# Patient Record
Sex: Female | Born: 1960 | Race: White | Hispanic: No | Marital: Married | Smoking: Never smoker
Health system: Southern US, Community
[De-identification: ages and names within clinical notes are randomized; demographics above are authoritative.]

## PROBLEM LIST (undated history)

## (undated) DIAGNOSIS — E785 Hyperlipidemia, unspecified: Secondary | ICD-10-CM

## (undated) DIAGNOSIS — T7840XA Allergy, unspecified, initial encounter: Secondary | ICD-10-CM

## (undated) DIAGNOSIS — R002 Palpitations: Secondary | ICD-10-CM

## (undated) DIAGNOSIS — I10 Essential (primary) hypertension: Secondary | ICD-10-CM

## (undated) DIAGNOSIS — K219 Gastro-esophageal reflux disease without esophagitis: Secondary | ICD-10-CM

## (undated) DIAGNOSIS — E559 Vitamin D deficiency, unspecified: Secondary | ICD-10-CM

## (undated) DIAGNOSIS — M722 Plantar fascial fibromatosis: Secondary | ICD-10-CM

## (undated) DIAGNOSIS — G5600 Carpal tunnel syndrome, unspecified upper limb: Secondary | ICD-10-CM

## (undated) DIAGNOSIS — R739 Hyperglycemia, unspecified: Secondary | ICD-10-CM

## (undated) HISTORY — DX: Carpal tunnel syndrome, unspecified upper limb: G56.00

## (undated) HISTORY — PX: TONSILLECTOMY: SUR1361

## (undated) HISTORY — PX: MENISCUS REPAIR: SHX5179

## (undated) HISTORY — DX: Hyperglycemia, unspecified: R73.9

## (undated) HISTORY — DX: Essential (primary) hypertension: I10

## (undated) HISTORY — PX: FEMUR FRACTURE SURGERY: SHX633

## (undated) HISTORY — DX: Hyperlipidemia, unspecified: E78.5

## (undated) HISTORY — DX: Plantar fascial fibromatosis: M72.2

## (undated) HISTORY — DX: Allergy, unspecified, initial encounter: T78.40XA

## (undated) HISTORY — DX: Vitamin D deficiency, unspecified: E55.9

## (undated) HISTORY — DX: Gastro-esophageal reflux disease without esophagitis: K21.9

## (undated) HISTORY — PX: CYST REMOVAL HAND: SHX6279

## (undated) HISTORY — DX: Palpitations: R00.2

---

## 2008-01-27 ENCOUNTER — Ambulatory Visit: Payer: Self-pay | Admitting: Family Medicine

## 2008-02-26 ENCOUNTER — Ambulatory Visit: Payer: Self-pay | Admitting: Orthopedic Surgery

## 2009-02-17 ENCOUNTER — Emergency Department: Payer: Self-pay | Admitting: Emergency Medicine

## 2009-02-21 ENCOUNTER — Ambulatory Visit: Payer: Self-pay | Admitting: Family Medicine

## 2009-03-27 HISTORY — PX: MENISCUS REPAIR: SHX5179

## 2009-11-28 IMAGING — US THYROID ULTRASOUND
1 series · 10 of 10 positions shown · non-contrast
Comparison: none

REASON FOR EXAM: lymphadenopathy  patient can show area of LT side of neck
COMMENTS:

PROCEDURE:     US  - US SOFT TISSUE HEAD/NECK  - February 21, 2009 [DATE]
RESULT:     Images obtained through the left side of the neck demonstrate no
definite solid or cystic abnormality. CT would be preferable for evaluation
of possible neck mass.

[Series 1: thyroid ultrasound · 10 of 10 slices shown]
[im 1/10]
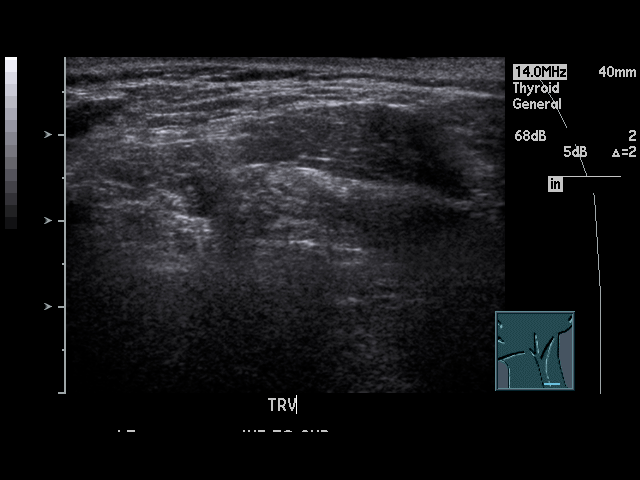
[im 2/10]
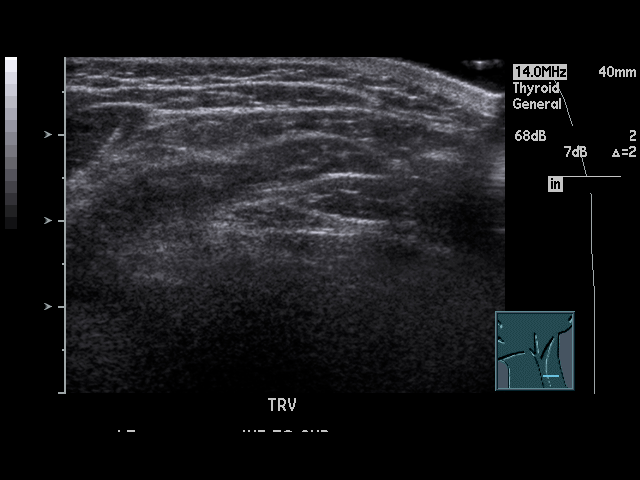
[im 3/10]
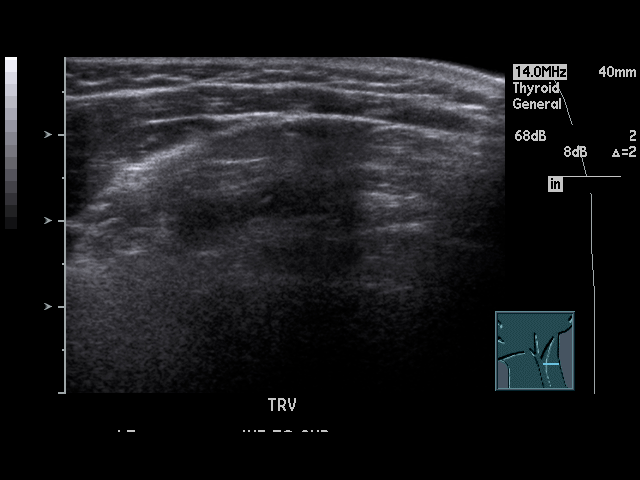
[im 4/10]
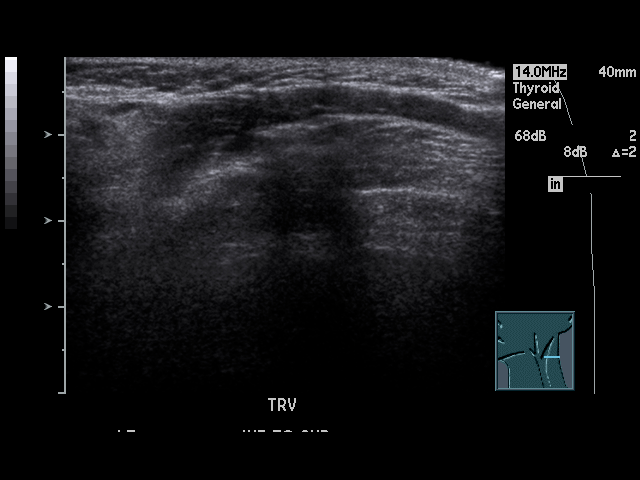
[im 5/10]
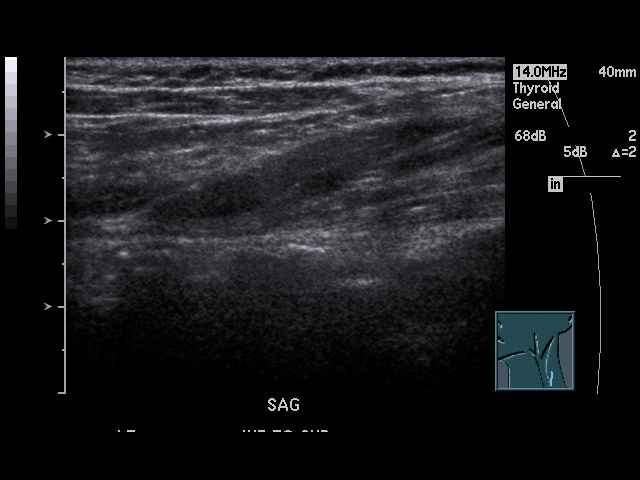
[im 6/10]
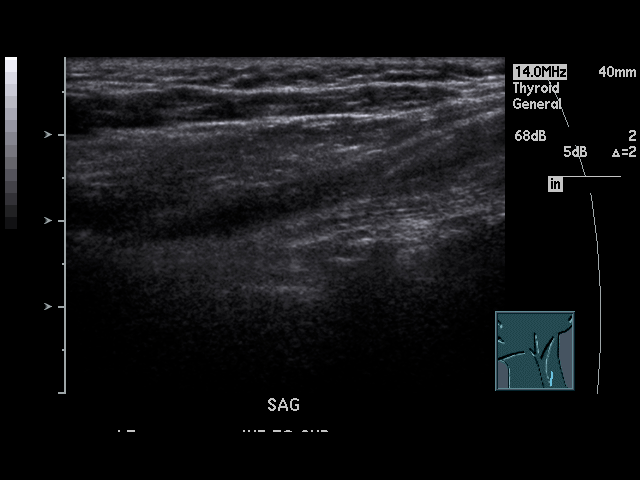
[im 7/10]
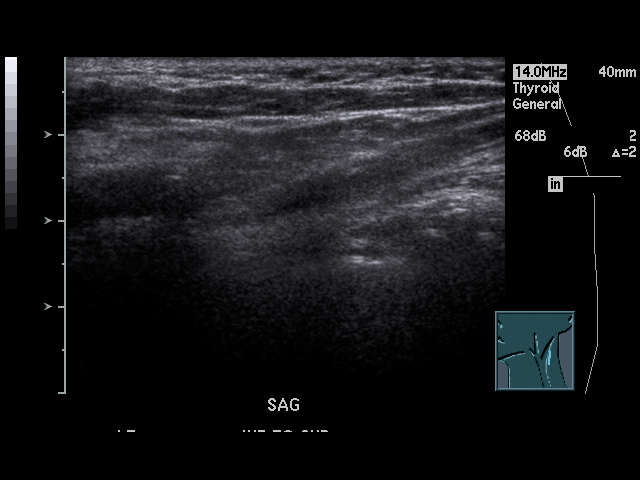
[im 8/10]
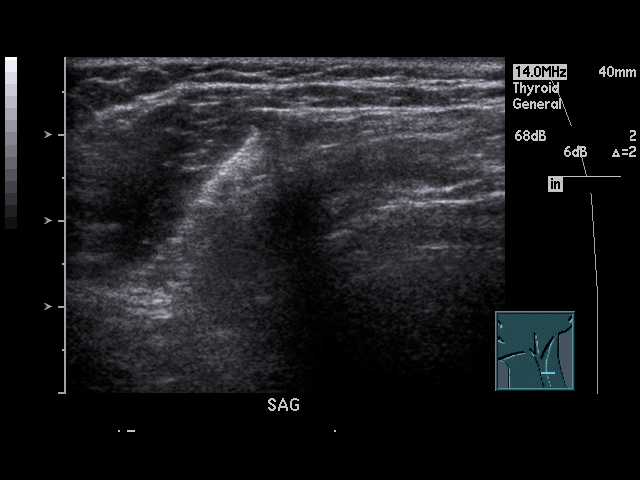
[im 9/10]
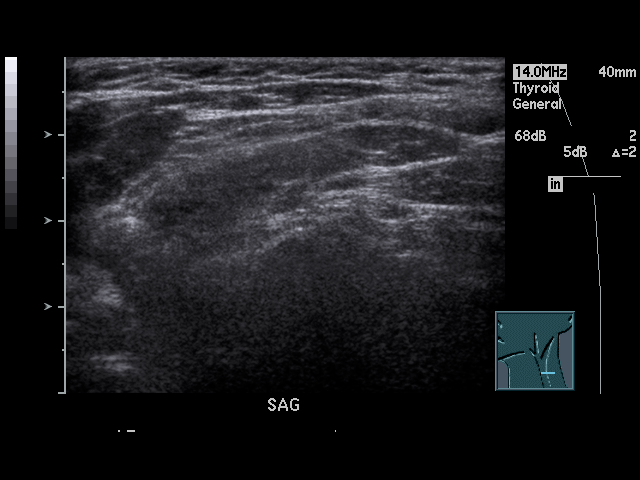
[im 10/10]
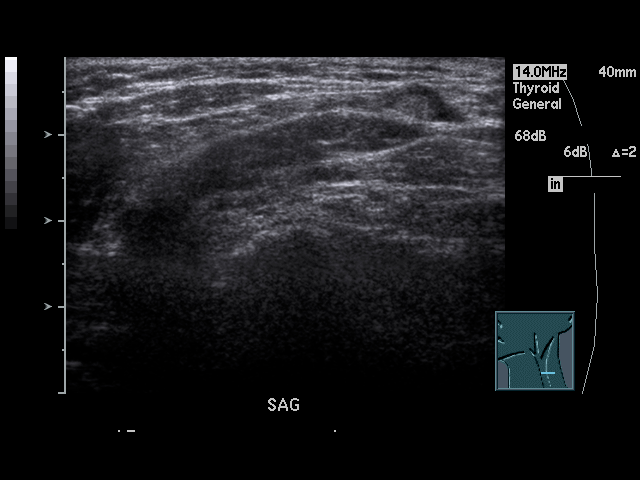

[10 of 10 positions shown; findings below may reference images not displayed]

IMPRESSION: Negative targeted soft tissue ultrasound.

## 2010-06-28 ENCOUNTER — Ambulatory Visit: Payer: Self-pay | Admitting: Family Medicine

## 2010-06-28 LAB — HM MAMMOGRAPHY: HM Mammogram: NORMAL

## 2010-09-12 ENCOUNTER — Ambulatory Visit: Payer: Self-pay | Admitting: Unknown Physician Specialty

## 2010-09-14 ENCOUNTER — Ambulatory Visit: Payer: Self-pay | Admitting: Unknown Physician Specialty

## 2010-09-17 LAB — PATHOLOGY REPORT

## 2013-07-15 ENCOUNTER — Encounter: Payer: Self-pay | Admitting: Podiatrist

## 2013-07-15 ENCOUNTER — Ambulatory Visit (INDEPENDENT_AMBULATORY_CARE_PROVIDER_SITE_OTHER): Payer: BC Managed Care – PPO | Admitting: Podiatrist

## 2013-07-15 ENCOUNTER — Ambulatory Visit (INDEPENDENT_AMBULATORY_CARE_PROVIDER_SITE_OTHER): Payer: BC Managed Care – PPO

## 2013-07-15 VITALS — BP 126/66 | HR 69 | Resp 18 | Ht 64.5 in | Wt 167.8 lb

## 2013-07-15 DIAGNOSIS — M2040 Other hammer toe(s) (acquired), unspecified foot: Secondary | ICD-10-CM

## 2013-07-15 DIAGNOSIS — Q828 Other specified congenital malformations of skin: Secondary | ICD-10-CM

## 2013-07-15 NOTE — Progress Notes (Signed)
   Subjective:    Patient ID: Cindy Larsen, female    DOB: September 20, 1960, 53 y.o.   MRN: [id number]  HPI Comments: There is a spot on the bottom of one of my feet and a curled up toe on the other  Left foot porokeratosis mid foot down 4th met . Sore , tried to pick at it.  Right #2 toe sometimes hurts it depends on the day      Review of Systems  All other systems reviewed and are negative.       Objective:   Physical Exam GENERAL APPEARANCE: Alert, conversant. Appropriately groomed. No acute distress.  VASCULAR: Pedal pulses palpable at 2/4 DP and PT bilateral.  Capillary refill time is immediate to all digits,  Proximal to distal cooling it warm to warm.  Digital hair growth is present bilateral  NEUROLOGIC: sensation is intact epicritically and protectively to 5.07 monofilament at 5/5 sites bilateral.  Light touch is intact bilateral, vibratory sensation intact bilateral, achilles tendon reflex is intact bilateral.  MUSCULOSKELETAL: acceptable muscle strength, tone and stability bilateral.  Hammertoe deformity right 2nd toe.  Mild flexion contracture digits 3,4 right foot as well.  otherwise Rectus appearance of foot and digits noted bilateral.   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  Ground glass appearance noted.     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    Plan: Debridement of the porokeratotic lesion was carried out today without complication. Discussed hammertoe contracture right second toe and recommended bracing, shoe gear changes and lastly surgical correction. At this time the hammertoe is not bothersome and she will try conservative treatments.

## 2013-07-15 NOTE — Patient Instructions (Signed)
amlactin or LacHydrin are good moisturizers for dry feet-- in the foot care isle at the pharmacy  Wart remover acid will help the little plugged sweat duct on your left foot.  Apply daily for 5 days and then pop out the little center piece

## 2014-06-27 HISTORY — PX: TRIGGER FINGER RELEASE: SHX641

## 2014-06-27 HISTORY — PX: CARPAL TUNNEL RELEASE: SHX101

## 2014-10-13 ENCOUNTER — Other Ambulatory Visit: Payer: Self-pay | Admitting: Family Medicine

## 2014-10-13 DIAGNOSIS — Z1239 Encounter for other screening for malignant neoplasm of breast: Secondary | ICD-10-CM

## 2014-10-13 LAB — LIPID PANEL
Cholesterol: 183 mg/dL (ref 0–200)
HDL: 58 mg/dL (ref 35–70)
LDL CALC: 96 mg/dL
Triglycerides: 143 mg/dL (ref 40–160)

## 2014-10-13 LAB — HM PAP SMEAR: HM Pap smear: NORMAL

## 2014-10-13 LAB — HEMOGLOBIN A1C: Hgb A1c MFr Bld: 6.1 % — AB (ref 4.0–6.0)

## 2015-04-25 ENCOUNTER — Other Ambulatory Visit: Payer: Self-pay

## 2015-04-27 ENCOUNTER — Other Ambulatory Visit: Payer: Self-pay

## 2015-04-27 MED ORDER — AMLODIPINE BESY-BENAZEPRIL HCL 5-20 MG PO CAPS: 1.0000 | ORAL_CAPSULE | Freq: Every day | ORAL | Status: DC

## 2015-04-27 MED ORDER — HYDROCHLOROTHIAZIDE 12.5 MG PO CAPS: 12.5000 mg | ORAL_CAPSULE | Freq: Every day | ORAL | Status: DC

## 2015-04-27 MED ORDER — LEVOCETIRIZINE DIHYDROCHLORIDE 5 MG PO TABS: 5.0000 mg | ORAL_TABLET | Freq: Every evening | ORAL | Status: DC

## 2015-04-27 NOTE — Telephone Encounter (Signed)
Left voice message for patient to return call to schedule appointment. °

## 2015-05-01 ENCOUNTER — Encounter: Payer: Self-pay | Admitting: Family Medicine

## 2015-05-01 ENCOUNTER — Ambulatory Visit (INDEPENDENT_AMBULATORY_CARE_PROVIDER_SITE_OTHER): Payer: BLUE CROSS/BLUE SHIELD | Admitting: Family Medicine

## 2015-05-01 VITALS — BP 134/86 | HR 98 | Temp 97.8°F | Resp 18 | Ht 63.0 in | Wt 175.2 lb

## 2015-05-01 DIAGNOSIS — K219 Gastro-esophageal reflux disease without esophagitis: Secondary | ICD-10-CM

## 2015-05-01 DIAGNOSIS — J309 Allergic rhinitis, unspecified: Secondary | ICD-10-CM | POA: Diagnosis not present

## 2015-05-01 DIAGNOSIS — E669 Obesity, unspecified: Secondary | ICD-10-CM

## 2015-05-01 DIAGNOSIS — E8881 Metabolic syndrome: Secondary | ICD-10-CM | POA: Diagnosis not present

## 2015-05-01 DIAGNOSIS — Z1211 Encounter for screening for malignant neoplasm of colon: Secondary | ICD-10-CM

## 2015-05-01 DIAGNOSIS — Z23 Encounter for immunization: Secondary | ICD-10-CM | POA: Diagnosis not present

## 2015-05-01 DIAGNOSIS — E663 Overweight: Secondary | ICD-10-CM | POA: Insufficient documentation

## 2015-05-01 DIAGNOSIS — Z1239 Encounter for other screening for malignant neoplasm of breast: Secondary | ICD-10-CM | POA: Diagnosis not present

## 2015-05-01 DIAGNOSIS — I10 Essential (primary) hypertension: Secondary | ICD-10-CM | POA: Diagnosis not present

## 2015-05-01 DIAGNOSIS — E785 Hyperlipidemia, unspecified: Secondary | ICD-10-CM | POA: Insufficient documentation

## 2015-05-01 DIAGNOSIS — J3089 Other allergic rhinitis: Secondary | ICD-10-CM

## 2015-05-01 DIAGNOSIS — E559 Vitamin D deficiency, unspecified: Secondary | ICD-10-CM | POA: Diagnosis not present

## 2015-05-01 MED ORDER — RANITIDINE HCL 300 MG PO CAPS: 300.0000 mg | ORAL_CAPSULE | Freq: Every evening | ORAL | Status: DC

## 2015-05-01 MED ORDER — LEVOCETIRIZINE DIHYDROCHLORIDE 5 MG PO TABS: 5.0000 mg | ORAL_TABLET | Freq: Every evening | ORAL | Status: DC

## 2015-05-01 MED ORDER — AMLODIPINE BESY-BENAZEPRIL HCL 5-20 MG PO CAPS
1.0000 | ORAL_CAPSULE | Freq: Every day | ORAL | Status: DC
Start: 2015-05-01 — End: 2015-10-30

## 2015-05-01 MED ORDER — HYDROCHLOROTHIAZIDE 12.5 MG PO CAPS: 12.5000 mg | ORAL_CAPSULE | Freq: Every day | ORAL | Status: DC

## 2015-05-01 NOTE — Progress Notes (Signed)
Name: Cindy Larsen   MRN: [id number]    DOB: 04/12/1961   Date:05/01/2015       Progress Note  Subjective  Chief Complaint  Chief Complaint  Patient presents with  . Medication Refill    6 month F/U  . Hypertension  . Allergic Rhinitis     Well controlled with medications.  . Gastroesophageal Reflux    Well controlled only takes medication prn    HPI  HTN: she has been taking Lotrel and HCTZ. No side effects of medication, occasionally a mild ankle swelling. No chest pain or palpitation ( resolved when she stopped drinking caffeinated beverage).  AR: symptoms are year round. She usually has nasal congestion, sneezing and itchy watery eyes. Controlled at this time with medication. Symptoms are worse during the spring.   GERD: doing very well lately. Taking Omeprazole prn only, at most twice monthly.  When symptoms are present feels a heartburn sensation, no regurgitation. Discussed stopping PPI and only taking Ranitidine prn .   Dyslipidemia: had labs done in May and is on diet only  Vitamin D : taking supplements.   Metabolic Syndrome: denies polyphagia, polydipsia or polyuria. She is trying to cut down on sweets and is also only drinking diet drinks.   Obesity: discussed her BMI, explained that losing about 10 lbs will be very beneficial to her.   Patient Active Problem List   Diagnosis Date Noted  . Benign essential HTN 05/01/2015  . Dyslipidemia 05/01/2015  . Gastro-esophageal reflux disease without esophagitis 05/01/2015  . Metabolic syndrome 05/01/2015  . Obesity 05/01/2015  . Perennial allergic rhinitis 05/01/2015  . Vitamin D deficiency 08/02/2009    Past Surgical History  Procedure Laterality Date  . Cyst removal hand    . Meniscus repair    . Tonsillectomy    . Femur fracture surgery Left   . Meniscus repair Left 03/27/2009  . Carpal tunnel release Right 06/2014  . Trigger finger release Right 06/2014    Family History  Problem Relation Age of Onset   . Diabetes Mother     Social History   Social History  . Marital Status: Married    Spouse Name: N/A  . Number of Children: N/A  . Years of Education: N/A   Occupational History  . Not on file.   Social History Main Topics  . Smoking status: Never Smoker   . Smokeless tobacco: Never Used  . Alcohol Use: No  . Drug Use: No  . Sexual Activity:    Partners: Male   Other Topics Concern  . Not on file   Social History Narrative     Current outpatient prescriptions:  .  amLODipine-benazepril (LOTREL) 5-20 MG capsule, Take 1 capsule by mouth daily., Disp: 90 capsule, Rfl: 1 .  Cholecalciferol (VITAMIN D) 2000 UNITS tablet, Take by mouth., Disp: , Rfl:  .  hydrochlorothiazide (MICROZIDE) 12.5 MG capsule, Take 1 capsule (12.5 mg total) by mouth daily., Disp: 90 capsule, Rfl: 1 .  levocetirizine (XYZAL) 5 MG tablet, Take 1 tablet (5 mg total) by mouth every evening., Disp: 90 tablet, Rfl: 1  No Known Allergies   ROS  Constitutional: Negative for fever or weight change.  Respiratory: Negative for cough and shortness of breath.   Cardiovascular: Negative for chest pain or palpitations.  Gastrointestinal: Negative for abdominal pain, no bowel changes.  Musculoskeletal: Negative for gait problem or joint swelling.  Skin: Negative for rash.  Neurological: Negative for dizziness or headache.  No other  specific complaints in a complete review of systems (except as listed in HPI above).   Objective  Filed Vitals:   05/01/15 1528  BP: 134/86  Pulse: 98  Temp: 97.8 F (36.6 C)  TempSrc: Oral  Resp: 18  Height: 5\' 3"  (1.6 m)  Weight: 175 lb 3.2 oz (79.47 kg)  SpO2: 95%    Body mass index is 31.04 kg/(m^2).  Physical Exam  Constitutional: Patient appears well-developed and well-nourished. Obese No distress.  HEENT: head atraumatic, normocephalic, pupils equal and reactive to light,neck supple, throat within normal limits Cardiovascular: Normal rate, regular rhythm  and normal heart sounds.  No murmur heard. No BLE edema. Pulmonary/Chest: Effort normal and breath sounds normal. No respiratory distress. Abdominal: Soft.  There is no tenderness. Psychiatric: Patient has a normal mood and affect. behavior is normal. Judgment and thought content normal.  PHQ2/9: Depression screen PHQ 2/9 05/01/2015  Decreased Interest 0  Down, Depressed, Hopeless 0  PHQ - 2 Score 0    Fall Risk: Fall Risk  05/01/2015  Falls in the past year? No     Functional Status Survey: Is the patient deaf or have difficulty hearing?: No Does the patient have difficulty seeing, even when wearing glasses/contacts?: Yes (glasses) Does the patient have difficulty concentrating, remembering, or making decisions?: No Does the patient have difficulty walking or climbing stairs?: No Does the patient have difficulty dressing or bathing?: No Does the patient have difficulty doing errands alone such as visiting a doctor's office or shopping?: No    Assessment & Plan  1. Benign essential HTN  - hydrochlorothiazide (MICROZIDE) 12.5 MG capsule; Take 1 capsule (12.5 mg total) by mouth daily.  Dispense: 90 capsule; Refill: 1 - amLODipine-benazepril (LOTREL) 5-20 MG capsule; Take 1 capsule by mouth daily.  Dispense: 90 capsule; Refill: 1  2. Needs flu shot  - Flu Vaccine QUAD 36+ mos PF IM (Fluarix & Fluzone Quad PF) -refused  3. Perennial allergic rhinitis  - levocetirizine (XYZAL) 5 MG tablet; Take 1 tablet (5 mg total) by mouth every evening.  Dispense: 90 tablet; Refill: 1  4. Metabolic syndrome  Discussed life style changes.   5. Dyslipidemia  Continue dietary modification   6. Gastro-esophageal reflux disease without esophagitis  - ranitidine (ZANTAC) 300 MG capsule; Take 1 capsule (300 mg total) by mouth every evening.  Dispense: 90 capsule; Refill: 0  7. Vitamin D deficiency  Continue supplements  8. Obesity  Discussed with the patient the risk posed by an  increased BMI. Discussed importance of portion control, calorie counting and at least 150 minutes of physical activity weekly. Avoid sweet beverages and drink more water. Eat at least 6 servings of fruit and vegetables daily   9. Breast cancer screening  - MM Digital Screening; Future  10. Colon cancer screening  She does not want to have a colonoscopy but is willing to call insurance and find out about cologuard coverage - Cologuard

## 2015-05-04 ENCOUNTER — Telehealth: Payer: Self-pay | Admitting: Family Medicine

## 2015-05-04 MED ORDER — AZITHROMYCIN 250 MG PO TABS: ORAL_TABLET | ORAL | Status: DC

## 2015-05-04 NOTE — Telephone Encounter (Signed)
Patient informed. 

## 2015-05-04 NOTE — Telephone Encounter (Signed)
She was seen on Monday and since then she became congested, ears fill full with slight cough. It began Tuesday. Patient is taking Mucinex and it does not seem to be working. [facility] send antibiotic [facility].

## 2015-05-04 NOTE — Telephone Encounter (Signed)
done

## 2015-06-29 ENCOUNTER — Ambulatory Visit (INDEPENDENT_AMBULATORY_CARE_PROVIDER_SITE_OTHER): Payer: BLUE CROSS/BLUE SHIELD | Admitting: Family Medicine

## 2015-06-29 ENCOUNTER — Encounter: Payer: Self-pay | Admitting: Family Medicine

## 2015-06-29 VITALS — BP 120/78 | HR 116 | Temp 98.0°F | Resp 16 | Ht 63.0 in | Wt 176.1 lb

## 2015-06-29 DIAGNOSIS — J069 Acute upper respiratory infection, unspecified: Secondary | ICD-10-CM | POA: Diagnosis not present

## 2015-06-29 NOTE — Progress Notes (Signed)
Name: Cindy Larsen   MRN: [id number]    DOB: 13-Mar-1961   Date:06/29/2015       Progress Note  Subjective  Chief Complaint  Chief Complaint  Patient presents with  . URI    Onset 1 day, syptoms include: sore throat, congestion, bilateral ear pain    URI  This is a new problem. The current episode started yesterday. There has been no fever. Associated symptoms include congestion, ear pain, a plugged ear sensation and a sore throat. Pertinent negatives include no coughing or sinus pain. She has tried nothing for the symptoms.    Past Medical History  Diagnosis Date  . Hypertension   . Allergy   . Vitamin D deficiency   . Palpitations   . Carpal tunnel syndrome   . GERD (gastroesophageal reflux disease)   . Dyslipidemia   . Plantar fasciitis, right   . Hyperglycemia     Past Surgical History  Procedure Laterality Date  . Cyst removal hand    . Meniscus repair    . Tonsillectomy    . Femur fracture surgery Left   . Meniscus repair Left 03/27/2009  . Carpal tunnel release Right 06/2014  . Trigger finger release Right 06/2014    Family History  Problem Relation Age of Onset  . Diabetes Mother     Social History   Social History  . Marital Status: Married    Spouse Name: N/A  . Number of Children: N/A  . Years of Education: N/A   Occupational History  . Not on file.   Social History Main Topics  . Smoking status: Never Smoker   . Smokeless tobacco: Never Used  . Alcohol Use: No  . Drug Use: No  . Sexual Activity:    Partners: Male   Other Topics Concern  . Not on file   Social History Narrative     Current outpatient prescriptions:  .  amLODipine-benazepril (LOTREL) 5-20 MG capsule, Take 1 capsule by mouth daily., Disp: 90 capsule, Rfl: 1 .  Cholecalciferol (VITAMIN D) 2000 UNITS tablet, Take by mouth., Disp: , Rfl:  .  hydrochlorothiazide (MICROZIDE) 12.5 MG capsule, Take 1 capsule (12.5 mg total) by mouth daily., Disp: 90 capsule, Rfl: 1 .   levocetirizine (XYZAL) 5 MG tablet, Take 1 tablet (5 mg total) by mouth every evening., Disp: 90 tablet, Rfl: 1 .  ranitidine (ZANTAC) 300 MG capsule, Take 1 capsule (300 mg total) by mouth every evening., Disp: 90 capsule, Rfl: 0  No Known Allergies   Review of Systems  Constitutional: Negative for fever and chills.  HENT: Positive for congestion, ear pain and sore throat.   Respiratory: Negative for cough.      Objective  Filed Vitals:   06/29/15 1359  BP: 120/78  Pulse: 116  Temp: 98 F (36.7 C)  TempSrc: Oral  Resp: 16  Height:  (1.6 m)  Weight: 176 lb 1.6 oz (79.878 kg)  SpO2: 96%    Physical Exam  Constitutional: She is oriented to person, place, and time and well-developed, well-nourished, and in no distress.  HENT:  Head: Normocephalic and atraumatic.  Right Ear: Tympanic membrane and ear canal normal. No drainage. No middle ear effusion.  Left Ear: Tympanic membrane and ear canal normal. No drainage.  No middle ear effusion.  Nose: Right sinus exhibits no maxillary sinus tenderness and no frontal sinus tenderness. Left sinus exhibits no maxillary sinus tenderness and no frontal sinus tenderness.  Mouth/Throat: Posterior oropharyngeal erythema present.  No oropharyngeal exudate or posterior oropharyngeal edema.  Nasal turibinate hypertrophied, nasal mucosa inflamed Inflamed oropharynx w/o injection.  Cardiovascular: Normal rate, regular rhythm and normal heart sounds.   Pulmonary/Chest: Effort normal and breath sounds normal.  Neurological: She is alert and oriented to person, place, and time.  Nursing note and vitals reviewed.    Assessment & Plan  1. Viral URI Recommended conservative measures including increased hydration. May take a decongestant for relief. If symptoms persistent beyond 5 days, will call for ABX therapy.   Hyun Marsalis Asad [facility] 06/29/2015 2:13 PM

## 2015-10-30 ENCOUNTER — Encounter: Payer: Self-pay | Admitting: Family Medicine

## 2015-10-30 ENCOUNTER — Ambulatory Visit (INDEPENDENT_AMBULATORY_CARE_PROVIDER_SITE_OTHER): Payer: BLUE CROSS/BLUE SHIELD | Admitting: Family Medicine

## 2015-10-30 VITALS — BP 116/74 | HR 75 | Temp 97.9°F | Resp 16 | Ht 63.0 in | Wt 179.1 lb

## 2015-10-30 DIAGNOSIS — E559 Vitamin D deficiency, unspecified: Secondary | ICD-10-CM | POA: Diagnosis not present

## 2015-10-30 DIAGNOSIS — E8881 Metabolic syndrome: Secondary | ICD-10-CM

## 2015-10-30 DIAGNOSIS — J3089 Other allergic rhinitis: Secondary | ICD-10-CM

## 2015-10-30 DIAGNOSIS — K219 Gastro-esophageal reflux disease without esophagitis: Secondary | ICD-10-CM

## 2015-10-30 DIAGNOSIS — E785 Hyperlipidemia, unspecified: Secondary | ICD-10-CM

## 2015-10-30 DIAGNOSIS — R252 Cramp and spasm: Secondary | ICD-10-CM | POA: Diagnosis not present

## 2015-10-30 DIAGNOSIS — Z1239 Encounter for other screening for malignant neoplasm of breast: Secondary | ICD-10-CM

## 2015-10-30 DIAGNOSIS — I10 Essential (primary) hypertension: Secondary | ICD-10-CM

## 2015-10-30 DIAGNOSIS — J309 Allergic rhinitis, unspecified: Secondary | ICD-10-CM | POA: Diagnosis not present

## 2015-10-30 MED ORDER — HYDROCHLOROTHIAZIDE 12.5 MG PO CAPS: 12.5000 mg | ORAL_CAPSULE | Freq: Every day | ORAL | Status: DC

## 2015-10-30 MED ORDER — AMLODIPINE BESY-BENAZEPRIL HCL 5-20 MG PO CAPS: 1.0000 | ORAL_CAPSULE | Freq: Every day | ORAL | Status: DC

## 2015-10-30 MED ORDER — LEVOCETIRIZINE DIHYDROCHLORIDE 5 MG PO TABS: 5.0000 mg | ORAL_TABLET | Freq: Every evening | ORAL | Status: DC

## 2015-10-30 NOTE — Patient Instructions (Signed)
Restless Legs Syndrome Restless legs syndrome is a condition that causes uncomfortable feelings or sensations in the legs, especially while sitting or lying down. The sensations usually cause an overwhelming urge to move the legs. The arms can also sometimes be affected. The condition can range from mild to severe. The symptoms often interfere with a person's ability to sleep. CAUSES The cause of this condition is not known. RISK FACTORS This condition is more likely to develop in:  People who are older than age 50.  Pregnant women. In general, restless legs syndrome is more common in women than in men.  People who have a family history of the condition.  People who have certain medical conditions, such as iron deficiency, kidney disease, Parkinson disease, or nerve damage.  People who take certain medicines, such as medicines for high blood pressure, nausea, colds, allergies, depression, and some heart conditions. SYMPTOMS The main symptom of this condition is uncomfortable sensations in the legs. These sensations may be:  Described as pulling, tingling, prickling, throbbing, crawling, or burning.  Worse while you are sitting or lying down.  Worse during periods of rest or inactivity.  Worse at night, often interfering with your sleep.  Accompanied by a very strong urge to move your legs.  Temporarily relieved by movement of your legs. The sensations usually affect both sides of the body. The arms can also be affected, but this is rare. People who have this condition often have tiredness during the day because of their lack of sleep at night. DIAGNOSIS This condition may be diagnosed based on your description of the symptoms. You may also have tests, including blood tests, to check for other conditions that may lead to your symptoms. In some cases, you may be asked to spend some time in a sleep lab so your sleeping can be monitored. TREATMENT Treatment for this condition is  focused on managing the symptoms. Treatment may include:  Self-help and lifestyle changes.  Medicines. HOME CARE INSTRUCTIONS  Take medicines only as directed by your health care provider.  Try these methods to get temporary relief from the uncomfortable sensations:  Massage your legs.  Walk or stretch.  Take a cold or hot bath.  Practice good sleep habits. For example, go to bed and get up at the same time every day.  Exercise regularly.  Practice ways of relaxing, such as yoga or meditation.  Avoid caffeine and alcohol.  Do not use any tobacco products, including cigarettes, chewing tobacco, or electronic cigarettes. If you need help quitting, ask your health care provider.  Keep all follow-up visits as directed by your health care provider. This is important. SEEK MEDICAL CARE IF: Your symptoms do not improve with treatment, or they get worse.   This information is not intended to replace advice given to you by your health care provider. Make sure you discuss any questions you have with your health care provider.   Document Released: 05/03/2002 Document Revised: 09/27/2014 Document Reviewed: 05/09/2014 Elsevier Interactive Patient Education 2016 [employer]  

## 2015-10-30 NOTE — Progress Notes (Signed)
Name: Cindy Larsen   MRN: [id number]    DOB: 1960/07/10   Date:10/30/2015       Progress Note  Subjective  Chief Complaint  Chief Complaint  Patient presents with  . Medication Refill  . Hypertension  . Allergic Rhinitis     Well controlled with medication daily   . Gastroesophageal Reflux    Takes medication as needed    HPI   She was scheduled for a CPE but asked to change to medication refill visit, will return for CPE  HTN: she has been taking Lotrel and HCTZ. No side effects of medication, occasionally a mild ankle swelling. No chest pain or palpitation. BP is towards low end of normal, but denies orthostatic changes.  AR: symptoms are year round. She usually has nasal congestion, sneezing or  itchy watery eyes when not taking medication. Controlled at this time with medication.  GERD: doing very well lately. Off PPI, only taking Ranitidine prn .   Dyslipidemia: had labs done in May and is on diet only  Vitamin D : taking supplements, we will recheck labs before her CPE  Metabolic Syndrome: denies polyphagia, polydipsia or polyuria. She is still avoiding sweet beverages but has not been as compliant with food choices, eating more cookies and desserts.  Obesity: discussed her BMI, explained that losing about 10 lbs will be very beneficial to her. Discussed increasing physical activity and avoid eating "junk food"  Patient Active Problem List   Diagnosis Date Noted  . Benign essential HTN 05/01/2015  . Dyslipidemia 05/01/2015  . Gastro-esophageal reflux disease without esophagitis 05/01/2015  . Metabolic syndrome 05/01/2015  . Obesity 05/01/2015  . Perennial allergic rhinitis 05/01/2015  . Vitamin D deficiency 08/02/2009    Past Surgical History  Procedure Laterality Date  . Cyst removal hand    . Meniscus repair    . Tonsillectomy    . Femur fracture surgery Left   . Meniscus repair Left 03/27/2009  . Carpal tunnel release Right 06/2014  . Trigger finger  release Right 06/2014    Family History  Problem Relation Age of Onset  . Diabetes Mother     Social History   Social History  . Marital Status: Married    Spouse Name: N/A  . Number of Children: N/A  . Years of Education: N/A   Occupational History  . Not on file.   Social History Main Topics  . Smoking status: Never Smoker   . Smokeless tobacco: Never Used  . Alcohol Use: No  . Drug Use: No  . Sexual Activity:    Partners: Male   Other Topics Concern  . Not on file   Social History Narrative     Current outpatient prescriptions:  .  amLODipine-benazepril (LOTREL) 5-20 MG capsule, Take 1 capsule by mouth daily., Disp: 90 capsule, Rfl: 1 .  Cholecalciferol (VITAMIN D) 2000 UNITS tablet, Take by mouth., Disp: , Rfl:  .  hydrochlorothiazide (MICROZIDE) 12.5 MG capsule, Take 1 capsule (12.5 mg total) by mouth daily., Disp: 90 capsule, Rfl: 1 .  levocetirizine (XYZAL) 5 MG tablet, Take 1 tablet (5 mg total) by mouth every evening., Disp: 90 tablet, Rfl: 1 .  ranitidine (ZANTAC) 300 MG capsule, Take 1 capsule (300 mg total) by mouth every evening., Disp: 90 capsule, Rfl: 0  No Known Allergies   ROS  Constitutional: Negative for fever or weight change.  Respiratory: Negative for cough and shortness of breath.   Cardiovascular: Negative for chest pain or  palpitations.  Gastrointestinal: Negative for abdominal pain, no bowel changes.  Musculoskeletal: Negative for gait problem or joint swelling.  Skin: Negative for rash.  Neurological: Negative for dizziness or headache.  No other specific complaints in a complete review of systems (except as listed in HPI above).  Objective  Filed Vitals:   10/30/15 0830  BP: 116/74  Pulse: 75  Temp: 97.9 F (36.6 C)  TempSrc: Oral  Resp: 16  Height:  (1.6 m)  Weight: 179 lb 1.6 oz (81.239 kg)  SpO2: 97%    Body mass index is 31.73 kg/(m^2).  Physical Exam  Constitutional: Patient appears well-developed and  well-nourished. Obese  No distress.  HEENT: head atraumatic, normocephalic, pupils equal and reactive to light, neck supple, throat within normal limits Cardiovascular: Normal rate, regular rhythm and normal heart sounds.  No murmur heard. No BLE edema. Pulmonary/Chest: Effort normal and breath sounds normal. No respiratory distress. Abdominal: Soft.  There is no tenderness. Psychiatric: Patient has a normal mood and affect. behavior is normal. Judgment and thought content normal.  PHQ2/9: Depression screen [facility] 2/9 10/30/2015 05/01/2015  Decreased Interest 0 0  Down, Depressed, Hopeless 0 0  PHQ - 2 Score 0 0    Fall Risk: Fall Risk  10/30/2015 05/01/2015  Falls in the past year? No No    Functional Status Survey: Is the patient deaf or have difficulty hearing?: No Does the patient have difficulty seeing, even when wearing glasses/contacts?: No Does the patient have difficulty concentrating, remembering, or making decisions?: No Does the patient have difficulty walking or climbing stairs?: No Does the patient have difficulty dressing or bathing?: No Does the patient have difficulty doing errands alone such as visiting a doctor's office or shopping?: No    Assessment & Plan  1. Perennial allergic rhinitis  - levocetirizine (XYZAL) 5 MG tablet; Take 1 tablet (5 mg total) by mouth every evening.  Dispense: 90 tablet; Refill: 1  2. Benign essential HTN  - hydrochlorothiazide (MICROZIDE) 12.5 MG capsule; Take 1 capsule (12.5 mg total) by mouth daily.  Dispense: 90 capsule; Refill: 1 - amLODipine-benazepril (LOTREL) 5-20 MG capsule; Take 1 capsule by mouth daily.  Dispense: 90 capsule; Refill: 1 - Comprehensive metabolic panel - CBC with Differential/Platelet  3. Dyslipidemia  - Lipid panel  4. Vitamin D deficiency  - VITAMIN D 25 Hydroxy (Vit-D Deficiency, Fractures)  5. Metabolic syndrome  - Hemoglobin A1c  6. Gastro-esophageal reflux disease without esophagitis  Doing  well with prn medication   7. Breast cancer screening  - MM Digital Screening; Future  8. Cramp of both lower extremities  - Magnesium

## 2015-11-18 ENCOUNTER — Encounter: Payer: Self-pay | Admitting: Family Medicine

## 2015-11-18 DIAGNOSIS — S83206A Unspecified tear of unspecified meniscus, current injury, right knee, initial encounter: Secondary | ICD-10-CM | POA: Insufficient documentation

## 2016-04-30 ENCOUNTER — Ambulatory Visit (INDEPENDENT_AMBULATORY_CARE_PROVIDER_SITE_OTHER): Payer: BLUE CROSS/BLUE SHIELD | Admitting: Family Medicine

## 2016-04-30 ENCOUNTER — Encounter: Payer: Self-pay | Admitting: Family Medicine

## 2016-04-30 VITALS — BP 118/70 | HR 88 | Temp 97.7°F | Resp 16 | Ht 64.25 in | Wt 177.2 lb

## 2016-04-30 DIAGNOSIS — E8881 Metabolic syndrome: Secondary | ICD-10-CM | POA: Diagnosis not present

## 2016-04-30 DIAGNOSIS — J3089 Other allergic rhinitis: Secondary | ICD-10-CM

## 2016-04-30 DIAGNOSIS — Z1211 Encounter for screening for malignant neoplasm of colon: Secondary | ICD-10-CM | POA: Diagnosis not present

## 2016-04-30 DIAGNOSIS — Z01419 Encounter for gynecological examination (general) (routine) without abnormal findings: Secondary | ICD-10-CM

## 2016-04-30 DIAGNOSIS — Z1231 Encounter for screening mammogram for malignant neoplasm of breast: Secondary | ICD-10-CM | POA: Diagnosis not present

## 2016-04-30 DIAGNOSIS — E785 Hyperlipidemia, unspecified: Secondary | ICD-10-CM

## 2016-04-30 DIAGNOSIS — I10 Essential (primary) hypertension: Secondary | ICD-10-CM

## 2016-04-30 DIAGNOSIS — Z0001 Encounter for general adult medical examination with abnormal findings: Secondary | ICD-10-CM | POA: Diagnosis not present

## 2016-04-30 DIAGNOSIS — E559 Vitamin D deficiency, unspecified: Secondary | ICD-10-CM

## 2016-04-30 DIAGNOSIS — R5383 Other fatigue: Secondary | ICD-10-CM

## 2016-04-30 DIAGNOSIS — Z1239 Encounter for other screening for malignant neoplasm of breast: Secondary | ICD-10-CM

## 2016-04-30 DIAGNOSIS — Z23 Encounter for immunization: Secondary | ICD-10-CM

## 2016-04-30 MED ORDER — HYDROCHLOROTHIAZIDE 12.5 MG PO CAPS: 12.5000 mg | ORAL_CAPSULE | Freq: Every day | ORAL | 1 refills | Status: DC

## 2016-04-30 MED ORDER — LEVOCETIRIZINE DIHYDROCHLORIDE 5 MG PO TABS: 5.0000 mg | ORAL_TABLET | Freq: Every evening | ORAL | 1 refills | Status: DC

## 2016-04-30 MED ORDER — AMLODIPINE BESY-BENAZEPRIL HCL 5-20 MG PO CAPS: 1.0000 | ORAL_CAPSULE | Freq: Every day | ORAL | 1 refills | Status: DC

## 2016-04-30 NOTE — Progress Notes (Signed)
Name: Cindy Larsen   MRN: [id number]    DOB: [birth year]   Date:04/30/2016       Progress Note  Subjective  Chief Complaint  Chief Complaint  Patient presents with  . Annual Exam  . Medication Refill    6 month F/U  . Hypertension    Denies any symptoms   . Allergic Rhinitis     Well controlled with medication  . Gastroesophageal Reflux    Takes prn as needed  . Dyslipidemia    HPI  Well Woman exam: she is up to date with pap smear, she is interested in Cologuard instead of colonoscopy, no breast lumps, she is due for a mammogram. She has nocturia, but she takes diuretic at night, no symptoms during the day.   HTN: she has been taking Lotrel and HCTZ. No side effects of medication, occasionally a mild ankle swelling. No chest pain or palpitation. BP is towards low end of normal, but denies orthostatic changes.   AR: symptoms are year round. She usually has nasal congestion, sneezing and itchy watery eyes when not taking medication. Controlled at this time with medication. She needs refills  GERD: doing very well lately. Off PPI, only taking Ranitidine prn . Very seldom, she states hot chocolate is the worse for her  Dyslipidemia: had labs done in May and is on diet only  Vitamin D : she has been off vitamin D supplementation   Metabolic Syndrome: denies polyphagia, polydipsia or polyuria. She is still avoiding sweet beverages but has not been as compliant with food choices  OA: she has OA bilaterally knee, seeing by [facility] , had steroid injections initially, and is now getting Synvisc to see if symptoms will improve.  She may need to have knee replacement surgery   Obesity: discussed her BMI, she still likes eating junk food, but lost 2 lbs since last visit. She has not been physically active   Patient Active Problem List   Diagnosis Date Noted  . Right knee meniscal tear 11/18/2015  . Cramp of both lower extremities 10/30/2015  . Benign essential HTN  05/01/2015  . Dyslipidemia 05/01/2015  . Gastro-esophageal reflux disease without esophagitis 05/01/2015  . Metabolic syndrome 05/01/2015  . Obesity 05/01/2015  . Perennial allergic rhinitis 05/01/2015  . Vitamin D deficiency 08/02/2009    Past Surgical History:  Procedure Laterality Date  . CARPAL TUNNEL RELEASE Right 06/2014  . CYST REMOVAL HAND    . FEMUR FRACTURE SURGERY Left   . MENISCUS REPAIR    . MENISCUS REPAIR Left 03/27/2009  . TONSILLECTOMY    . TRIGGER FINGER RELEASE Right 06/2014    Family History  Problem Relation Age of Onset  . Diabetes Mother     Social History   Social History  . Marital status: Married    Spouse name: N/A  . Number of children: N/A  . Years of education: N/A   Occupational History  . Not on file.   Social History Main Topics  . Smoking status: Never Smoker  . Smokeless tobacco: Never Used  . Alcohol use No  . Drug use: No  . Sexual activity: Yes    Partners: Male   Other Topics Concern  . Not on file   Social History Narrative  . No narrative on file     Current Outpatient Prescriptions:  .  amLODipine-benazepril (LOTREL) 5-20 MG capsule, Take 1 capsule by mouth daily., Disp: 90 capsule, Rfl: 1 .  hydrochlorothiazide (MICROZIDE) 12.5 MG  capsule, Take 1 capsule (12.5 mg total) by mouth daily., Disp: 90 capsule, Rfl: 1 .  levocetirizine (XYZAL) 5 MG tablet, Take 1 tablet (5 mg total) by mouth every evening., Disp: 90 tablet, Rfl: 1 .  Cholecalciferol (VITAMIN D) 2000 UNITS tablet, Take by mouth., Disp: , Rfl:   No Known Allergies   ROS  Constitutional: Negative for fever or weight change.  Respiratory: Negative for cough and shortness of breath.   Cardiovascular: Negative for chest pain or palpitations.  Gastrointestinal: Negative for abdominal pain, no bowel changes.  Musculoskeletal: Negative for gait problem or joint swelling.  Skin: Negative for rash.  Neurological: Negative for dizziness or headache.  No  other specific complaints in a complete review of systems (except as listed in HPI above).  Objective  Vitals:   04/30/16 0821  BP: 118/70  Pulse: 88  Resp: 16  Temp: 97.7 F (36.5 C)  TempSrc: Oral  SpO2: 96%  Weight: 177 lb 3.2 oz (80.4 kg)  Height: 5' 4.25" (1.632 m)    Body mass index is 30.18 kg/m.  Physical Exam  Constitutional: Patient appears well-developed and obese No distress.  HENT: Head: Normocephalic and atraumatic. Ears: B TMs ok, no erythema or effusion; Nose: Nose normal. Mouth/Throat: Oropharynx is clear and moist. No oropharyngeal exudate.  Eyes: Conjunctivae and EOM are normal. Pupils are equal, round, and reactive to light. No scleral icterus.  Neck: Normal range of motion. Neck supple. No JVD present. No thyromegaly present.  Cardiovascular: Normal rate, regular rhythm and normal heart sounds.  No murmur heard. No BLE edema. Pulmonary/Chest: Effort normal and breath sounds normal. No respiratory distress. Abdominal: Soft. Bowel sounds are normal, no distension. There is no tenderness. no masses Breast: no lumps or masses, no nipple discharge or rashes FEMALE GENITALIA:  External genitalia normal External urethra normal Pelvic not done Bimanual exam normal without masses RECTAL: not done Musculoskeletal: Normal range of motion, no joint effusions. No gross deformities. Crepitus with extension of both knee Neurological: he is alert and oriented to person, place, and time. No cranial nerve deficit. Coordination, balance, strength, speech and gait are normal.  Skin: Skin is warm and dry. No rash noted. No erythema.  Psychiatric: Patient has a normal mood and affect. behavior is normal. Judgment and thought content normal.  PHQ2/9: Depression screen [facility] 2/9 04/30/2016 10/30/2015 05/01/2015  Decreased Interest 0 0 0  Down, Depressed, Hopeless 0 0 0  PHQ - 2 Score 0 0 0     Fall Risk: Fall Risk  04/30/2016 10/30/2015 05/01/2015  Falls in the past year? No No  No     Functional Status Survey: Is the patient deaf or have difficulty hearing?: No Does the patient have difficulty seeing, even when wearing glasses/contacts?: No Does the patient have difficulty concentrating, remembering, or making decisions?: No Does the patient have difficulty walking or climbing stairs?: No Does the patient have difficulty dressing or bathing?: No Does the patient have difficulty doing errands alone such as visiting a doctor's office or shopping?: No    Assessment & Plan  1. Well woman exam  Discussed importance of 150 minutes of physical activity weekly, eat two servings of fish weekly, eat one serving of tree nuts ( cashews, pistachios, pecans, almonds.Marland Kitchen.) every other day, eat 6 servings of fruit/vegetables daily and drink plenty of water and avoid sweet beverages.  - MM Digital Screening; Future - Cologuard - Lipid panel - Hemoglobin A1c - COMPLETE METABOLIC PANEL WITH GFR - CBC with  Differential/Platelet - VITAMIN D 25 Hydroxy (Vit-D Deficiency, Fractures) - TSH - Vitamin B12  2. Needs flu shot  refused  3. Benign essential HTN  - hydrochlorothiazide (MICROZIDE) 12.5 MG capsule; Take 1 capsule (12.5 mg total) by mouth daily.  Dispense: 90 capsule; Refill: 1 - amLODipine-benazepril (LOTREL) 5-20 MG capsule; Take 1 capsule by mouth daily.  Dispense: 90 capsule; Refill: 1 - COMPLETE METABOLIC PANEL WITH GFR - CBC with Differential/Platelet  4. Dyslipidemia  - Lipid panel  5. Vitamin D deficiency  - VITAMIN D 25 Hydroxy (Vit-D Deficiency, Fractures)  6. Metabolic syndrome  - Hemoglobin A1c  7. Breast cancer screening  - MM Digital Screening; Future  8. Colon cancer screening  - Cologuard  9. Perennial allergic rhinitis  - levocetirizine (XYZAL) 5 MG tablet; Take 1 tablet (5 mg total) by mouth every evening.  Dispense: 90 tablet; Refill: 1  10. Other fatigue  - TSH - Vitamin B12

## 2016-05-02 LAB — CBC WITH DIFFERENTIAL/PLATELET
BASOS ABS: 48 {cells}/uL (ref 0–200)
Basophils Relative: 1 %
EOS PCT: 3 %
Eosinophils Absolute: 144 cells/uL (ref 15–500)
HEMATOCRIT: 44.7 % (ref 35.0–45.0)
HEMOGLOBIN: 14.7 g/dL (ref 11.7–15.5)
LYMPHS ABS: 1488 {cells}/uL (ref 850–3900)
LYMPHS PCT: 31 %
MCH: 30 pg (ref 27.0–33.0)
MCHC: 32.9 g/dL (ref 32.0–36.0)
MCV: 91.2 fL (ref 80.0–100.0)
MONO ABS: 240 {cells}/uL (ref 200–950)
MPV: 10.5 fL (ref 7.5–12.5)
Monocytes Relative: 5 %
NEUTROS PCT: 60 %
Neutro Abs: 2880 cells/uL (ref 1500–7800)
Platelets: 212 10*3/uL (ref 140–400)
RBC: 4.9 MIL/uL (ref 3.80–5.10)
RDW: 13.4 % (ref 11.0–15.0)
WBC: 4.8 10*3/uL (ref 3.8–10.8)

## 2016-05-03 ENCOUNTER — Ambulatory Visit: Payer: Self-pay

## 2016-05-03 LAB — COMPLETE METABOLIC PANEL WITH GFR
ALBUMIN: 4.3 g/dL (ref 3.6–5.1)
ALK PHOS: 90 U/L (ref 33–130)
ALT: 17 U/L (ref 6–29)
AST: 13 U/L (ref 10–35)
BUN: 18 mg/dL (ref 7–25)
CALCIUM: 9.7 mg/dL (ref 8.6–10.4)
CO2: 29 mmol/L (ref 20–31)
CREATININE: 0.87 mg/dL (ref 0.50–1.05)
Chloride: 103 mmol/L (ref 98–110)
GFR, Est African American: 87 mL/min (ref >=60)
GFR, Est Non African American: 76 mL/min (ref >=60)
GLUCOSE: 125 mg/dL — AB (ref 65–99)
POTASSIUM: 4.3 mmol/L (ref 3.5–5.3)
SODIUM: 140 mmol/L (ref 135–146)
Total Bilirubin: 0.5 mg/dL (ref 0.2–1.2)
Total Protein: 6.7 g/dL (ref 6.1–8.1)

## 2016-05-03 LAB — VITAMIN B12: Vitamin B-12: 315 pg/mL (ref 200–1100)

## 2016-05-03 LAB — LIPID PANEL
CHOLESTEROL: 197 mg/dL (ref <200)
HDL: 66 mg/dL (ref >50)
LDL Cholesterol: 110 mg/dL — ABNORMAL HIGH (ref <100)
TRIGLYCERIDES: 104 mg/dL (ref <150)
Total CHOL/HDL Ratio: 3 Ratio (ref <5.0)
VLDL: 21 mg/dL (ref <30)

## 2016-05-03 LAB — HEMOGLOBIN A1C
Hgb A1c MFr Bld: 6.3 % — ABNORMAL HIGH (ref <5.7)
Mean Plasma Glucose: 134 mg/dL

## 2016-05-03 LAB — VITAMIN D 25 HYDROXY (VIT D DEFICIENCY, FRACTURES): Vit D, 25-Hydroxy: 26 ng/mL — ABNORMAL LOW (ref 30–100)

## 2016-05-03 LAB — TSH: TSH: 1.36 m[IU]/L

## 2016-05-06 ENCOUNTER — Ambulatory Visit
Admission: RE | Admit: 2016-05-06 | Discharge: 2016-05-06 | Disposition: A | Discharge status: Home / Self Care | Payer: BLUE CROSS/BLUE SHIELD | Source: Ambulatory Visit | Attending: Family Medicine | Admitting: Family Medicine

## 2016-05-06 DIAGNOSIS — Z01419 Encounter for gynecological examination (general) (routine) without abnormal findings: Secondary | ICD-10-CM | POA: Diagnosis not present

## 2016-05-06 DIAGNOSIS — Z1239 Encounter for other screening for malignant neoplasm of breast: Secondary | ICD-10-CM

## 2016-05-06 DIAGNOSIS — Z1231 Encounter for screening mammogram for malignant neoplasm of breast: Secondary | ICD-10-CM | POA: Diagnosis not present

## 2016-08-26 ENCOUNTER — Telehealth: Payer: Self-pay

## 2016-08-26 NOTE — Telephone Encounter (Signed)
Patient lost her insurance and Pharmacist called to see if Dr. Carlynn Purl would ok to split up her Lotrel medication instead of keeping it combined due to cost. Ok for the pharmacy to split medication due to cost with current prescription 90 with 0 refills.

## 2016-11-05 ENCOUNTER — Encounter: Payer: Self-pay | Admitting: Family Medicine

## 2016-11-05 ENCOUNTER — Ambulatory Visit (INDEPENDENT_AMBULATORY_CARE_PROVIDER_SITE_OTHER): Payer: Self-pay | Admitting: Family Medicine

## 2016-11-05 VITALS — BP 122/70 | HR 70 | Temp 97.5°F | Resp 16 | Ht 64.0 in | Wt 174.5 lb

## 2016-11-05 DIAGNOSIS — E559 Vitamin D deficiency, unspecified: Secondary | ICD-10-CM

## 2016-11-05 DIAGNOSIS — I10 Essential (primary) hypertension: Secondary | ICD-10-CM

## 2016-11-05 DIAGNOSIS — E8881 Metabolic syndrome: Secondary | ICD-10-CM

## 2016-11-05 DIAGNOSIS — K219 Gastro-esophageal reflux disease without esophagitis: Secondary | ICD-10-CM

## 2016-11-05 DIAGNOSIS — J3089 Other allergic rhinitis: Secondary | ICD-10-CM

## 2016-11-05 DIAGNOSIS — E663 Overweight: Secondary | ICD-10-CM

## 2016-11-05 MED ORDER — HYDROCHLOROTHIAZIDE 12.5 MG PO CAPS: 12.5000 mg | ORAL_CAPSULE | Freq: Every day | ORAL | 1 refills | Status: DC

## 2016-11-05 MED ORDER — AMLODIPINE BESYLATE 5 MG PO TABS: 5.0000 mg | ORAL_TABLET | Freq: Every day | ORAL | 1 refills | Status: DC

## 2016-11-05 MED ORDER — LEVOCETIRIZINE DIHYDROCHLORIDE 5 MG PO TABS: 5.0000 mg | ORAL_TABLET | Freq: Every evening | ORAL | 1 refills | Status: DC

## 2016-11-05 MED ORDER — METFORMIN HCL 500 MG PO TABS: 500.0000 mg | ORAL_TABLET | Freq: Two times a day (BID) | ORAL | 1 refills | Status: DC

## 2016-11-05 MED ORDER — BENAZEPRIL HCL 20 MG PO TABS: 20.0000 mg | ORAL_TABLET | Freq: Every day | ORAL | 1 refills | Status: DC

## 2016-11-05 NOTE — Progress Notes (Signed)
Name: Cindy Larsen   MRN: [id number]    DOB: 02-Nov-1960   Date:11/05/2016       Progress Note  Subjective  Chief Complaint  Chief Complaint  Patient presents with  . Hypertension    follow up    HPI  HTN: she has been taking Lotrel and HCTZ. No side effects of medication, no longer has ankle swelling. No chest pain or palpitation, no shortness of breath or vision changes. BP is towards low end of normal, but denies orthostatic changes.  AR: symptoms are year round. She usually has nasal congestion, sneezing or  itchy watery eyes when not taking medication. Controlled at this time with Xyzal.  No longer needing Singulair and doesn't tolerate Flonase.  GERD: doing very well lately. Off PPI, only taking Ranitidine very seldomly - states stopping sweet tea has made a huge difference.   Dyslipidemia: had labs done in December - LDL was 110, and is on diet only  Vitamin D: taking supplements and doing well. No fatigue recently.  Metabolic Syndrome: denies polyphagia, polydipsia or polyuria. She is still avoiding sweet beverages but has been decreasing her carbohydrate intake, still eating some cookies and desserts.  Obesity: discussed her BMI, explained that losing about 10 lbs will be very beneficial to her. Discussed increasing physical activity and avoid eating "junk food"   Patient Active Problem List   Diagnosis Date Noted  . Right knee meniscal tear 11/18/2015  . Cramp of both lower extremities 10/30/2015  . Benign essential HTN 05/01/2015  . Dyslipidemia 05/01/2015  . Gastro-esophageal reflux disease without esophagitis 05/01/2015  . Metabolic syndrome 05/01/2015  . Obesity 05/01/2015  . Perennial allergic rhinitis 05/01/2015  . Vitamin D deficiency 08/02/2009    Social History  Substance Use Topics  . Smoking status: Never Smoker  . Smokeless tobacco: Never Used  . Alcohol use No     Current Outpatient Prescriptions:  .  amLODipine-benazepril (LOTREL) 5-20  MG capsule, Take 1 capsule by mouth daily., Disp: 90 capsule, Rfl: 1 .  Cholecalciferol (VITAMIN D) 2000 UNITS tablet, Take by mouth., Disp: , Rfl:  .  hydrochlorothiazide (MICROZIDE) 12.5 MG capsule, Take 1 capsule (12.5 mg total) by mouth daily., Disp: 90 capsule, Rfl: 1 .  levocetirizine (XYZAL) 5 MG tablet, Take 1 tablet (5 mg total) by mouth every evening., Disp: 90 tablet, Rfl: 1  No Known Allergies  ROS  Constitutional: Negative for fever or weight change.  Respiratory: Negative for cough and shortness of breath.   Cardiovascular: Negative for chest pain or palpitations.  Gastrointestinal: Negative for abdominal pain, no bowel changes.  Musculoskeletal: Negative for gait problem or joint swelling.  Skin: Negative for rash.  Neurological: Negative for dizziness or headache.  No other specific complaints in a complete review of systems (except as listed in HPI above).  Objective  Vitals:   11/05/16 0755  BP: 122/70  Pulse: 70  Resp: 16  Temp: 97.5 F (36.4 C)  TempSrc: Oral  SpO2: 95%  Weight: 174 lb 8 oz (79.2 kg)  Height: 5\' 4"  (1.626 m)    Body mass index is 29.95 kg/m.  Nursing Note and Vital Signs reviewed.  Physical Exam  Constitutional: Patient appears well-developed and well-nourished. Overweight No distress.  HEENT: head atraumatic, normocephalic Cardiovascular: Normal rate, regular rhythm, S1/S2 present.  No murmur or rub heard. No BLE edema. Bilateral radial pulses +2. Pulmonary/Chest: Effort normal and breath sounds clear. No respiratory distress or retractions. Abdominal: Soft and non-tender, bowel sounds  present x4 quadrants. Psychiatric: Patient has a normal mood and affect. behavior is normal. Judgment and thought content normal.  No results found for this or any previous visit (from the past 2160 hour(s)).  Assessment & Plan  1. Metabolic syndrome  - metFORMIN (GLUCOPHAGE) 500 MG tablet; Take 1 tablet (500 mg total) by mouth 2 (two) times  daily with a meal.  Dispense: 180 tablet; Refill: 1 Discussed importance of staying hydrated and when to stop taking medication, and also possible side effects of new medication  - Will check A1C at next visit  2. Benign essential HTN  - hydrochlorothiazide (MICROZIDE) 12.5 MG capsule; Take 1 capsule (12.5 mg total) by mouth daily.  Dispense: 90 capsule; Refill: 1 - amLODipine (NORVASC) 5 MG tablet; Take 1 tablet (5 mg total) by mouth daily.  Dispense: 90 tablet; Refill: 1 - benazepril (LOTENSIN) 20 MG tablet; Take 1 tablet (20 mg total) by mouth daily.  Dispense: 90 tablet; Refill: 1  3. Perennial allergic rhinitis  - levocetirizine (XYZAL) 5 MG tablet; Take 1 tablet (5 mg total) by mouth every evening.  Dispense: 90 tablet; Refill: 1  4. Gastro-esophageal reflux disease without esophagitis Continue PRN Ranitidine  5. Vitamin D deficiency Continue Supplementation  6. Overweight (BMI 25.0-29.9) Lifestyle modifications. - metFORMIN (GLUCOPHAGE) 500 MG tablet; Take 1 tablet (500 mg total) by mouth 2 (two) times daily with a meal.  Dispense: 180 tablet; Refill: 1  -Red flags and when to present for emergency care or RTC including fever >101.52F, chest pain, shortness of breath, new/worsening/un-resolving symptoms, reviewed with patient at time of visit. Follow up and care instructions discussed and provided in AVS.  I saw the patient with Maurice SmallEmily Debbera Wolken, exam done by me  Alba CoryKrichna Sowles, MD [facility] 11/05/2016, 8:34 AM

## 2017-05-12 ENCOUNTER — Ambulatory Visit (INDEPENDENT_AMBULATORY_CARE_PROVIDER_SITE_OTHER): Payer: Self-pay | Admitting: Family Medicine

## 2017-05-12 ENCOUNTER — Encounter: Payer: Self-pay | Admitting: Family Medicine

## 2017-05-12 VITALS — BP 120/78 | HR 95 | Temp 97.9°F | Resp 14 | Ht 64.5 in | Wt 166.1 lb

## 2017-05-12 DIAGNOSIS — J3089 Other allergic rhinitis: Secondary | ICD-10-CM

## 2017-05-12 DIAGNOSIS — M5030 Other cervical disc degeneration, unspecified cervical region: Secondary | ICD-10-CM | POA: Insufficient documentation

## 2017-05-12 DIAGNOSIS — E663 Overweight: Secondary | ICD-10-CM

## 2017-05-12 DIAGNOSIS — I10 Essential (primary) hypertension: Secondary | ICD-10-CM

## 2017-05-12 DIAGNOSIS — G5600 Carpal tunnel syndrome, unspecified upper limb: Secondary | ICD-10-CM | POA: Insufficient documentation

## 2017-05-12 DIAGNOSIS — E785 Hyperlipidemia, unspecified: Secondary | ICD-10-CM

## 2017-05-12 DIAGNOSIS — M6530 Trigger finger, unspecified finger: Secondary | ICD-10-CM | POA: Insufficient documentation

## 2017-05-12 DIAGNOSIS — R634 Abnormal weight loss: Secondary | ICD-10-CM

## 2017-05-12 DIAGNOSIS — E8881 Metabolic syndrome: Secondary | ICD-10-CM

## 2017-05-12 MED ORDER — METFORMIN HCL 500 MG PO TABS: 500.0000 mg | ORAL_TABLET | Freq: Two times a day (BID) | ORAL | 1 refills | Status: DC

## 2017-05-12 MED ORDER — AMLODIPINE BESYLATE 5 MG PO TABS
5.0000 mg | ORAL_TABLET | Freq: Every day | ORAL | 1 refills | Status: DC
Start: 2017-05-12 — End: 2017-12-01

## 2017-05-12 MED ORDER — LEVOCETIRIZINE DIHYDROCHLORIDE 5 MG PO TABS: 5.0000 mg | ORAL_TABLET | Freq: Every evening | ORAL | 1 refills | Status: DC

## 2017-05-12 MED ORDER — HYDROCHLOROTHIAZIDE 12.5 MG PO CAPS: 12.5000 mg | ORAL_CAPSULE | Freq: Every day | ORAL | 1 refills | Status: DC

## 2017-05-12 MED ORDER — BENAZEPRIL HCL 20 MG PO TABS: 20.0000 mg | ORAL_TABLET | Freq: Every day | ORAL | 1 refills | Status: DC

## 2017-05-12 NOTE — Progress Notes (Signed)
Name: Cindy Larsen   MRN: [id number]    DOB: 08-Oct-1960   Date:05/12/2017       Progress Note  Subjective  Chief Complaint  Chief Complaint  Patient presents with  . Hypertension  . Hyperlipidemia    HPI   She does not have insurance and we changed from CPE to follow up  HTN: she has been taking Lotrel and HCTZ. No side effects of medication, no longer has ankle swelling. No chest pain , palpitation or SOB.   GERD: doing very well lately. She rarely needs to take Ranitidine.   Dyslipidemia: had labs done in December - LDL was 110, and is on diet only, recheck today   Vitamin D: taking supplements and doing well. No fatigue recently.  Metabolic Syndrome: denies polyphagia, polydipsia or polyuria. She is still avoiding sweet beverages but has been decreasing her carbohydrate intake, still eating some cookies and desserts. We started her on Metformin 10/2016 and she has lost 9 lbs since. 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 Carpal tunnel syndrome 05/12/2017  . Acquired trigger finger 05/12/2017  . DDD (degenerative disc disease), cervical 05/12/2017  . Right knee meniscal tear 11/18/2015  . Cramp of both lower extremities 10/30/2015  . Benign essential HTN 05/01/2015  . Dyslipidemia 05/01/2015  . Gastro-esophageal reflux disease without esophagitis 05/01/2015  . Metabolic syndrome 05/01/2015  . Overweight (BMI 25.0-29.9) 05/01/2015  . Perennial allergic rhinitis 05/01/2015  . Vitamin D deficiency 08/02/2009    Past Surgical History:  Procedure Laterality Date  . CARPAL TUNNEL RELEASE Right 06/2014  . CYST REMOVAL HAND    . FEMUR FRACTURE SURGERY Left   . MENISCUS REPAIR    . MENISCUS REPAIR Left 03/27/2009  . TONSILLECTOMY    . TRIGGER FINGER RELEASE Right 06/2014    Family History  Problem Relation Age of Onset  . Diabetes Mother   . Breast cancer Neg Hx      Social History   Socioeconomic History  . Marital status: Married    Spouse name: Roger ShelterGordon   . Number of children: 5  . Years of education: Not on file  . Highest education level: 12th grade  Social Needs  . Financial resource strain: Not hard at all  . Food insecurity - worry: Never true  . Food insecurity - inability: Never true  . Transportation needs - medical: No  . Transportation needs - non-medical: No  Occupational History  . Occupation: server     Comment: restaurant   Tobacco Use  . Smoking status: Never Smoker  . Smokeless tobacco: Never Used  Substance and Sexual Activity  . Alcohol use: No    Alcohol/week: 0.0 oz  . Drug use: No  . Sexual activity: Yes    Partners: Male  Other Topics Concern  . Not on file  Social History Narrative   Used to work for [employer] left work because her father in Social workerlaw was ill back in 2012. 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  amLODipine (NORVASC) 5 MG tablet, Take 1 tablet (5 mg total) by mouth daily., Disp: 90 tablet, Rfl: 1 .  benazepril (LOTENSIN) 20 MG tablet, Take 1 tablet (20 mg total) by  mouth daily., Disp: 90 tablet, Rfl: 1 .  Cholecalciferol (VITAMIN D) 2000 UNITS tablet, Take by mouth., Disp: , Rfl:  .  hydrochlorothiazide (MICROZIDE) 12.5 MG capsule, Take 1 capsule (12.5 mg total) by mouth daily., Disp: 90 capsule, Rfl: 1 .  levocetirizine (XYZAL) 5 MG tablet, Take 1 tablet (5 mg total) by mouth every evening., Disp: 90 tablet, Rfl: 1 .  metFORMIN (GLUCOPHAGE) 500 MG tablet, Take 1 tablet (500 mg total) by mouth 2 (two) times daily with a meal., Disp: 180 tablet, Rfl: 1  No Known Allergies   ROS  Constitutional: Negative for fever , positive for weight change.  Respiratory: Negative for cough and shortness of breath.   Cardiovascular: Negative for chest pain or  palpitations.  Gastrointestinal: Negative for abdominal pain, no bowel changes.  Musculoskeletal: Negative for gait problem or joint swelling.  Skin: Negative for rash.  Neurological: Negative for dizziness or headache.  No other specific complaints in a complete review of systems (except as listed in HPI above).   Objective  Vitals:   05/12/17 0840  BP: 120/78  Pulse: 95  Resp: 14  Temp: 97.9 F (36.6 C)  TempSrc: Oral  SpO2: 98%  Weight: 166 lb 1.6 oz (75.3 kg)  Height: 5' 4.5" (1.638 m)    Body mass index is 28.07 kg/m.  Physical Exam  Constitutional: Patient appears well-developed and well-nourished. Overweight No distress.  HEENT: head atraumatic, normocephalic, pupils equal and reactive to light, neck supple, throat within normal limits Cardiovascular: Normal rate, regular rhythm and normal heart sounds.  No murmur heard. No BLE edema. Pulmonary/Chest: Effort normal and breath sounds normal. No respiratory distress. Abdominal: Soft.  There is no tenderness. Psychiatric: Patient has a normal mood and affect. behavior is normal. Judgment and thought content normal.  PHQ2/9: [facility] screen [facility] 05/12/2017 04/30/2016 10/30/2015 05/01/2015  Decreased Interest 0 0 0 0  Down, Depressed, Hopeless 0 0 0 0  PHQ - 2 Score 0 0 0 0    Fall Risk: Fall Risk  05/12/2017 04/30/2016 10/30/2015 05/01/2015  Falls in the past year? No No No No     Functional Status Survey: Is the patient deaf or have difficulty hearing?: No Does the patient have difficulty seeing, even when wearing glasses/contacts?: No Does the patient have difficulty concentrating, remembering, or making decisions?: No Does the patient have difficulty walking or climbing stairs?: No Does the patient have difficulty dressing or bathing?: No Does the patient have difficulty doing errands alone such as visiting a doctor's office or shopping?: No    Assessment & Plan  1. Weight loss  Likely secondary to  change in activity level and also started on Metformin 6 months ago. However discussed labs and get mammogram, never had colonoscopy but discussed hemoccult cards - but she refused it today  - COMPLETE METABOLIC PANEL WITH GFR - CBC with Differential/Platelet - TSH  2. Benign essential HTN  At goal, no side effects of medication  - amLODipine (NORVASC) 5 MG tablet; Take 1 tablet (5 mg total) by mouth daily.  Dispense: 90 tablet; Refill: 1 - benazepril (LOTENSIN) 20 MG tablet; Take 1 tablet (20 mg total) by mouth daily.  Dispense: 90 tablet; Refill: 1 - hydrochlorothiazide (MICROZIDE) 12.5 MG capsule; Take 1 capsule (12.5 mg total) by mouth daily.  Dispense: 90 capsule; Refill: 1  3. Metabolic syndrome  - Hemoglobin A1c - metFORMIN (GLUCOPHAGE) 500 MG tablet; Take 1 tablet (500 mg total) by mouth 2 (two) times daily with  a meal.  Dispense: 180 tablet; Refill: 1  4. Overweight (BMI 25.0-29.9)  - metFORMIN (GLUCOPHAGE) 500 MG tablet; Take 1 tablet (500 mg total) by mouth 2 (two) times daily with a meal.  Dispense: 180 tablet; Refill: 1  5. Perennial allergic rhinitis  - levocetirizine (XYZAL) 5 MG tablet; Take 1 tablet (5 mg total) by mouth every evening.  Dispense: 90 tablet; Refill: 1  6. Dyslipidemia  - Lipid panel

## 2017-05-13 LAB — CBC WITH DIFFERENTIAL/PLATELET
BASOS ABS: 38 {cells}/uL (ref 0–200)
Basophils Relative: 1 %
EOS PCT: 4.2 %
Eosinophils Absolute: 160 cells/uL (ref 15–500)
HEMATOCRIT: 41.7 % (ref 35.0–45.0)
HEMOGLOBIN: 14.2 g/dL (ref 11.7–15.5)
LYMPHS ABS: 1155 {cells}/uL (ref 850–3900)
MCH: 29.7 pg (ref 27.0–33.0)
MCHC: 34.1 g/dL (ref 32.0–36.0)
MCV: 87.2 fL (ref 80.0–100.0)
MPV: 11.1 fL (ref 7.5–12.5)
Monocytes Relative: 6.8 %
NEUTROS ABS: 2189 {cells}/uL (ref 1500–7800)
Neutrophils Relative %: 57.6 %
Platelets: 194 10*3/uL (ref 140–400)
RBC: 4.78 10*6/uL (ref 3.80–5.10)
RDW: 12.2 % (ref 11.0–15.0)
Total Lymphocyte: 30.4 %
WBC mixed population: 258 cells/uL (ref 200–950)
WBC: 3.8 10*3/uL (ref 3.8–10.8)

## 2017-05-13 LAB — COMPLETE METABOLIC PANEL WITH GFR
AG Ratio: 1.9 (calc) (ref 1.0–2.5)
ALBUMIN MSPROF: 4.5 g/dL (ref 3.6–5.1)
ALT: 14 U/L (ref 6–29)
AST: 15 U/L (ref 10–35)
Alkaline phosphatase (APISO): 83 U/L (ref 33–130)
BUN: 19 mg/dL (ref 7–25)
CALCIUM: 9.8 mg/dL (ref 8.6–10.4)
CHLORIDE: 99 mmol/L (ref 98–110)
CO2: 31 mmol/L (ref 20–32)
Creat: 0.86 mg/dL (ref 0.50–1.05)
GFR, Est African American: 88 mL/min/{1.73_m2} (ref >=60)
GFR, Est Non African American: 76 mL/min/{1.73_m2} (ref >=60)
GLOBULIN: 2.4 g/dL (ref 1.9–3.7)
Glucose, Bld: 107 mg/dL — ABNORMAL HIGH (ref 65–99)
POTASSIUM: 4.2 mmol/L (ref 3.5–5.3)
Sodium: 143 mmol/L (ref 135–146)
TOTAL PROTEIN: 6.9 g/dL (ref 6.1–8.1)
Total Bilirubin: 0.6 mg/dL (ref 0.2–1.2)

## 2017-05-13 LAB — TSH: TSH: 1.5 m[IU]/L

## 2017-05-13 LAB — HEMOGLOBIN A1C
Hgb A1c MFr Bld: 6.2 % of total Hgb — ABNORMAL HIGH (ref <5.7)
Mean Plasma Glucose: 131 (calc)
eAG (mmol/L): 7.3 (calc)

## 2017-05-13 LAB — LIPID PANEL
CHOLESTEROL: 199 mg/dL (ref <200)
HDL: 64 mg/dL (ref >50)
LDL Cholesterol (Calc): 106 mg/dL (calc) — ABNORMAL HIGH
Non-HDL Cholesterol (Calc): 135 mg/dL (calc) — ABNORMAL HIGH (ref <130)
TRIGLYCERIDES: 169 mg/dL — AB (ref <150)
Total CHOL/HDL Ratio: 3.1 (calc) (ref <5.0)

## 2017-11-12 ENCOUNTER — Ambulatory Visit: Payer: Self-pay | Admitting: Family Medicine

## 2017-12-01 ENCOUNTER — Ambulatory Visit: Payer: Self-pay | Admitting: Nurse Practitioner

## 2017-12-01 ENCOUNTER — Encounter: Payer: Self-pay | Admitting: Nurse Practitioner

## 2017-12-01 VITALS — BP 120/80 | HR 70 | Temp 97.8°F | Resp 16 | Ht 64.5 in | Wt 165.3 lb

## 2017-12-01 DIAGNOSIS — E8881 Metabolic syndrome: Secondary | ICD-10-CM

## 2017-12-01 DIAGNOSIS — J3089 Other allergic rhinitis: Secondary | ICD-10-CM

## 2017-12-01 DIAGNOSIS — E663 Overweight: Secondary | ICD-10-CM

## 2017-12-01 DIAGNOSIS — R21 Rash and other nonspecific skin eruption: Secondary | ICD-10-CM

## 2017-12-01 DIAGNOSIS — I10 Essential (primary) hypertension: Secondary | ICD-10-CM

## 2017-12-01 MED ORDER — FAMOTIDINE 40 MG PO TABS: 40.0000 mg | ORAL_TABLET | Freq: Every day | ORAL | 0 refills | Status: AC

## 2017-12-01 MED ORDER — AMLODIPINE BESYLATE 5 MG PO TABS: 5.0000 mg | ORAL_TABLET | Freq: Every day | ORAL | 1 refills | Status: AC

## 2017-12-01 MED ORDER — HYDROCHLOROTHIAZIDE 12.5 MG PO CAPS: 12.5000 mg | ORAL_CAPSULE | Freq: Every day | ORAL | 1 refills | Status: AC

## 2017-12-01 MED ORDER — HYDROCORTISONE 0.5 % EX CREA: 1.0000 "application " | TOPICAL_CREAM | Freq: Two times a day (BID) | CUTANEOUS | 0 refills | Status: AC

## 2017-12-01 MED ORDER — HYDROXYZINE PAMOATE 25 MG PO CAPS: 25.0000 mg | ORAL_CAPSULE | Freq: Two times a day (BID) | ORAL | 0 refills | Status: AC

## 2017-12-01 MED ORDER — LEVOCETIRIZINE DIHYDROCHLORIDE 5 MG PO TABS: 5.0000 mg | ORAL_TABLET | Freq: Every evening | ORAL | 1 refills | Status: AC

## 2017-12-01 MED ORDER — BENAZEPRIL HCL 20 MG PO TABS: 20.0000 mg | ORAL_TABLET | Freq: Every day | ORAL | 1 refills | Status: AC

## 2017-12-01 MED ORDER — METFORMIN HCL 500 MG PO TABS: 500.0000 mg | ORAL_TABLET | Freq: Two times a day (BID) | ORAL | 1 refills | Status: AC

## 2017-12-01 NOTE — Progress Notes (Addendum)
Name: Cindy Larsen   MRN: [id number]    DOB: [birth year]   Date:12/01/2017       Progress Note  Subjective  Chief Complaint  Chief Complaint  Patient presents with  . Insect Bite    Knows that she was bitten by a flying ant on both arms. Bit on her lower left arm and upper right arm. She immediately broke out underneath her breast and underneath her armpits. She had severe itching, especially on her hands. Took hot shower with no relief and used ice compression with fair relief she was able to go to sleep after using the ice and taking Bendryl at 12:00.  . Medication Refill    HPI  Patient was bit twice by flying insect once on each arm and noted that she then started having redness and swelling of area that has increased in size, then broke out on chest and legs. Redness from chest and legs has gone away but areas on arms still present and itchy, not painful. Denies shortness of breath, chest pain, nausea, angioedema. Took 2 benadryl OTC  Last night and took hot shower.   Pt also requesting refills of chronic medications for hypertension and prediabetes: denies chest pain, blurry vision, dizziness, lightheadedness. Patient Active Problem List   Diagnosis Date Noted  . Carpal tunnel syndrome 05/12/2017  . Acquired trigger finger 05/12/2017  . DDD (degenerative disc disease), cervical 05/12/2017  . Right knee meniscal tear 11/18/2015  . Cramp of both lower extremities 10/30/2015  . Benign essential HTN 05/01/2015  . Dyslipidemia 05/01/2015  . Gastro-esophageal reflux disease without esophagitis 05/01/2015  . Metabolic syndrome 05/01/2015  . Overweight (BMI 25.0-29.9) 05/01/2015  . Perennial allergic rhinitis 05/01/2015  . Vitamin D deficiency 08/02/2009    Past Medical History:  Diagnosis Date  . Allergy   . Carpal tunnel syndrome   . Dyslipidemia   . GERD (gastroesophageal reflux disease)   . Hyperglycemia   . Hypertension   . Palpitations   . Plantar fasciitis, right   .  Vitamin D deficiency     Past Surgical History:  Procedure Laterality Date  . CARPAL TUNNEL RELEASE Right 06/2014  . CYST REMOVAL HAND    . FEMUR FRACTURE SURGERY Left   . MENISCUS REPAIR    . MENISCUS REPAIR Left 03/27/2009  . TONSILLECTOMY    . TRIGGER FINGER RELEASE Right 06/2014    Social History   Tobacco Use  . Smoking status: Never Smoker  . Smokeless tobacco: Never Used  Substance Use Topics  . Alcohol use: No    Alcohol/week: 0.0 oz     Current Outpatient Medications:  .  amLODipine (NORVASC) 5 MG tablet, Take 1 tablet (5 mg total) by mouth daily., Disp: 90 tablet, Rfl: 1 .  benazepril (LOTENSIN) 20 MG tablet, Take 1 tablet (20 mg total) by mouth daily., Disp: 90 tablet, Rfl: 1 .  Cholecalciferol (VITAMIN D) 2000 UNITS tablet, Take by mouth., Disp: , Rfl:  .  hydrochlorothiazide (MICROZIDE) 12.5 MG capsule, Take 1 capsule (12.5 mg total) by mouth daily., Disp: 90 capsule, Rfl: 1 .  levocetirizine (XYZAL) 5 MG tablet, Take 1 tablet (5 mg total) by mouth every evening., Disp: 90 tablet, Rfl: 1 .  metFORMIN (GLUCOPHAGE) 500 MG tablet, Take 1 tablet (500 mg total) by mouth 2 (two) times daily with a meal., Disp: 180 tablet, Rfl: 1  No Known Allergies  ROS   No other specific complaints in a complete review of systems (except as  listed in HPI above).  Objective  Vitals:   12/01/17 1540  BP: 120/80  Pulse: 70  Resp: 16  Temp: 97.8 F (36.6 C)  TempSrc: Oral  SpO2: 99%  Weight: 165 lb 4.8 oz (75 kg)  Height: 5' 4.5" (1.638 m)     Body mass index is 27.94 kg/m.  Nursing Note and Vital Signs reviewed.  Physical Exam  Skin:        Constitutional: Patient appears well-developed and well-nourished. No distress.  Cardiovascular: Normal rate-70 Pulmonary/Chest: Effort normal. No respiratory distress or retractions.  Skin: see diagram for areas of redness on left forearm and right upper arm, right arm warm to touch, no drainage or tenderness  noted. Psychiatric: Patient has a normal mood and affect. behavior is normal. Judgment and thought content normal.  No results found for this or any previous visit (from the past 72 hour(s)).  Assessment & Plan  1. Rash due to allergy - famotidine (PEPCID) 40 MG tablet; Take 1 tablet (40 mg total) by mouth daily.  Dispense: 14 tablet; Refill: 0 - hydrOXYzine (VISTARIL) 25 MG capsule; Take 1 capsule (25 mg total) by mouth 2 (two) times daily.  Dispense: 30 capsule; Refill: 0 - hydrocortisone cream 0.5 %; Apply 1 application topically 2 (two) times daily.  Dispense: 30 g; Refill: 0  2. Metabolic syndrome - metFORMIN (GLUCOPHAGE) 500 MG tablet; Take 1 tablet (500 mg total) by mouth 2 (two) times daily with a meal.  Dispense: 180 tablet; Refill: 1  3. Overweight (BMI 25.0-29.9) - metFORMIN (GLUCOPHAGE) 500 MG tablet; Take 1 tablet (500 mg total) by mouth 2 (two) times daily with a meal.  Dispense: 180 tablet; Refill: 1  4. Perennial allergic rhinitis - levocetirizine (XYZAL) 5 MG tablet; Take 1 tablet (5 mg total) by mouth every evening.  Dispense: 90 tablet; Refill: 1  5. Benign essential HTN - hydrochlorothiazide (MICROZIDE) 12.5 MG capsule; Take 1 capsule (12.5 mg total) by mouth daily.  Dispense: 90 capsule; Refill: 1 - benazepril (LOTENSIN) 20 MG tablet; Take 1 tablet (20 mg total) by mouth daily.  Dispense: 90 tablet; Refill: 1 - amLODipine (NORVASC) 5 MG tablet; Take 1 tablet (5 mg total) by mouth daily.  Dispense: 90 tablet; Refill: 1   -Red flags and when to present for emergency care or RTC including fever >101.44F, chest pain, shortness of breath, new/worsening/un-resolving symptoms,  reviewed with patient at time of visit. Follow up and care instructions discussed and provided in AVS. -Reviewed Health Maintenance: schedule physical and paps smear with PCP    I have reviewed this encounter including the documentation in this note and/or discussed this patient with the provider,  Sharyon Cable DNP AGNP-C. I am certifying that I agree with the content of this note as supervising physician. Alba Cory, MD [facility] 12/01/2017, 6:44 PM

## 2017-12-22 ENCOUNTER — Ambulatory Visit: Payer: Self-pay | Admitting: Family Medicine

## 2018-04-17 ENCOUNTER — Other Ambulatory Visit: Payer: Self-pay

## 2018-04-17 ENCOUNTER — Encounter: Payer: Self-pay | Admitting: Emergency Medicine

## 2018-04-17 ENCOUNTER — Emergency Department
Admission: EM | Admit: 2018-04-17 | Discharge: 2018-04-17 | Disposition: A | Discharge status: Home / Self Care | Payer: BLUE CROSS/BLUE SHIELD | Attending: Emergency Medicine | Admitting: Emergency Medicine

## 2018-04-17 DIAGNOSIS — J209 Acute bronchitis, unspecified: Secondary | ICD-10-CM

## 2018-04-17 DIAGNOSIS — Z79899 Other long term (current) drug therapy: Secondary | ICD-10-CM | POA: Insufficient documentation

## 2018-04-17 DIAGNOSIS — Z7984 Long term (current) use of oral hypoglycemic drugs: Secondary | ICD-10-CM | POA: Insufficient documentation

## 2018-04-17 DIAGNOSIS — I10 Essential (primary) hypertension: Secondary | ICD-10-CM | POA: Insufficient documentation

## 2018-04-17 MED ORDER — METHYLPREDNISOLONE 4 MG PO TBPK: ORAL_TABLET | ORAL | 0 refills | Status: AC

## 2018-04-17 MED ORDER — GUAIFENESIN-CODEINE 100-10 MG/5ML PO SOLN: 10.0000 mL | Freq: Three times a day (TID) | ORAL | 0 refills | Status: AC | PRN

## 2018-04-17 MED ORDER — AZITHROMYCIN 250 MG PO TABS: ORAL_TABLET | ORAL | 0 refills | Status: AC

## 2018-04-17 NOTE — ED Triage Notes (Signed)
Presents with prod cough  States cough started a few days ago  Now cough is prod  Greenish phlegm

## 2018-04-17 NOTE — ED Provider Notes (Addendum)
[facility] Emergency Department Provider Note   ____________________________________________   First MD Initiated Contact with Patient 04/17/18 1212     (approximate)  I have reviewed the triage vital signs and the nursing notes.   HISTORY  Chief Complaint Cough    HPI Cindy Larsen is a 57 y.o. female patient complain of 3 weeks of a nonproductive cough.  Patient states cough has increased in the past 3 days.  Patient the pain in the chest secondary to continued cough.  Patient states cough is productive for yellow-green sputum.  Patient rates the pain as a 4/10.  Patient described pain is "aching".  Patient denies nausea, vomiting, or diarrhea.  No pelvis measured for complaint.  Past Medical History:  Diagnosis Date  . Allergy   . Carpal tunnel syndrome   . Dyslipidemia   . GERD (gastroesophageal reflux disease)   . Hyperglycemia   . Hypertension   . Palpitations   . Plantar fasciitis, right   . Vitamin D deficiency     Patient Active Problem List   Diagnosis Date Noted  . Carpal tunnel syndrome 05/12/2017  . Acquired trigger finger 05/12/2017  . DDD (degenerative disc disease), cervical 05/12/2017  . Right knee meniscal tear 11/18/2015  . Cramp of both lower extremities 10/30/2015  . Benign essential HTN 05/01/2015  . Dyslipidemia 05/01/2015  . Gastro-esophageal reflux disease without esophagitis 05/01/2015  . Metabolic syndrome 05/01/2015  . Overweight (BMI 25.0-29.9) 05/01/2015  . Perennial allergic rhinitis 05/01/2015  . Vitamin D deficiency 08/02/2009    Past Surgical History:  Procedure Laterality Date  . CARPAL TUNNEL RELEASE Right 06/2014  . CYST REMOVAL HAND    . FEMUR FRACTURE SURGERY Left   . MENISCUS REPAIR    . MENISCUS REPAIR Left 03/27/2009  . TONSILLECTOMY    . TRIGGER FINGER RELEASE Right 06/2014    Prior to Admission medications   Medication Sig Start Date End Date Taking? Authorizing Provider  amLODipine  (NORVASC) 5 MG tablet Take 1 tablet (5 mg total) by mouth daily. 12/01/17   Poulose, Percell Belt, NP  azithromycin (ZITHROMAX Z-PAK) 250 MG tablet Take 2 tablets (500 mg) on  Day 1,  followed by 1 tablet (250 mg) once daily on Days 2 through 5. 04/17/18 04/22/18  Joni Reining, PA-C  benazepril (LOTENSIN) 20 MG tablet Take 1 tablet (20 mg total) by mouth daily. 12/01/17   Poulose, Percell Belt, NP  Cholecalciferol (VITAMIN D) 2000 UNITS tablet Take by mouth.    [provider]  famotidine (PEPCID) 40 MG tablet Take 1 tablet (40 mg total) by mouth daily. 12/01/17   Poulose, Percell Belt, NP  guaiFENesin-codeine 100-10 MG/5ML syrup Take 10 mLs by mouth 3 (three) times daily as needed for cough. 04/17/18   Joni Reining, PA-C  hydrochlorothiazide (MICROZIDE) 12.5 MG capsule Take 1 capsule (12.5 mg total) by mouth daily. 12/01/17   Poulose, Percell Belt, NP  hydrocortisone cream 0.5 % Apply 1 application topically 2 (two) times daily. 12/01/17   Poulose, Percell Belt, NP  hydrOXYzine (VISTARIL) 25 MG capsule Take 1 capsule (25 mg total) by mouth 2 (two) times daily. 12/01/17   Poulose, Percell Belt, NP  levocetirizine (XYZAL) 5 MG tablet Take 1 tablet (5 mg total) by mouth every evening. 12/01/17   Cheryle Horsfall, NP  metFORMIN (GLUCOPHAGE) 500 MG tablet Take 1 tablet (500 mg total) by mouth 2 (two) times daily with a meal. 12/01/17 03/01/18  Poulose, Percell Belt,  NP  methylPREDNISolone (MEDROL DOSEPAK) 4 MG TBPK tablet Take Tapered dose as directed 04/17/18   Joni ReiningSmith, Arilla Hice K, PA-C    Allergies Patient has no known allergies.  Family History  Problem Relation Age of Onset  . Diabetes Mother   . Breast cancer Neg Hx     Social History Social History   Tobacco Use  . Smoking status: Never Smoker  . Smokeless tobacco: Never Used  Substance Use Topics  . Alcohol use: No    Alcohol/week: 0.0 standard drinks  . Drug use: No    Review of Systems Constitutional: No fever/chills Eyes: No visual  changes. ENT: No sore throat. Cardiovascular: Denies chest pain. Respiratory: Denies shortness of breath.  Productive cough. Gastrointestinal: No abdominal pain.  No nausea, no vomiting.  No diarrhea.  No constipation. Genitourinary: Negative for dysuria. Musculoskeletal: Chest wall pain secondary to coughing. Skin: Negative for rash. Neurological: Negative for headaches, focal weakness or numbness.   ____________________________________________   PHYSICAL EXAM:  VITAL SIGNS: ED Triage Vitals  Enc Vitals Group     BP 04/17/18 1215 127/71     Pulse Rate 04/17/18 1215 65     Resp 04/17/18 1215 20     Temp 04/17/18 1215 97.8 F (36.6 C)     Temp Source 04/17/18 1215 Oral     SpO2 04/17/18 1215 100 %     Weight 04/17/18 1215 165 lb (74.8 kg)     Height 04/17/18 1215 5\' 4"  (1.626 m)     Head Circumference --      Peak Flow --      Pain Score 04/17/18 1210 4     Pain Loc --      Pain Edu? --      Excl. in GC? --    Constitutional: Alert and oriented. Well appearing and in no acute distress. Neck: No stridor.  Hematological/Lymphatic/Immunilogical: No cervical lymphadenopathy. Cardiovascular: Normal rate, regular rhythm. Grossly normal heart sounds.  Good peripheral circulation. Respiratory: Normal respiratory effort.  No retractions. Lungs Bilateral Rales. Skin:  Skin is warm, dry and intact. No rash noted. Psychiatric: Mood and affect are normal. Speech and behavior are normal.  ____________________________________________   LABS (all labs ordered are listed, but only abnormal results are displayed)  Labs Reviewed - No data to display ____________________________________________  EKG   ____________________________________________  RADIOLOGY  ED MD interpretation:    Official radiology report(s): No results found.  ____________________________________________   PROCEDURES  Procedure(s) performed: None  Procedures  Critical Care performed:  No  ____________________________________________   INITIAL IMPRESSION / ASSESSMENT AND PLAN / ED COURSE  As part of my medical decision making, I reviewed the following data within the electronic MEDICAL RECORD NUMBER    Patient presents with productive cough.  Differential consist of bronchitis versus pneumonia.  Patient given discharge care instructions.  Patient advised take medication as directed.  Patient advised to follow-up PCP if there is no improvement in 2 to 3 days.      ____________________________________________   FINAL CLINICAL IMPRESSION(S) / ED DIAGNOSES  Final diagnoses:  Acute bronchitis, unspecified organism     ED Discharge Orders         Ordered    guaiFENesin-codeine 100-10 MG/5ML syrup  3 times daily PRN     04/17/18 1235    methylPREDNISolone (MEDROL DOSEPAK) 4 MG TBPK tablet     04/17/18 1235    azithromycin (ZITHROMAX Z-PAK) 250 MG tablet     04/17/18 1235  Note:  This document was prepared using Dragon voice recognition software and may include unintentional dictation errors.    Joni Reining, PA-C 04/17/18 1240    Joni Reining, PA-C 04/17/18 1242    Emily Filbert, MD 04/17/18 1248

## 2018-06-01 ENCOUNTER — Encounter: Payer: Self-pay | Admitting: Family Medicine

## 2019-04-12 ENCOUNTER — Other Ambulatory Visit: Payer: Self-pay

## 2019-04-12 DIAGNOSIS — Z20822 Contact with and (suspected) exposure to covid-19: Secondary | ICD-10-CM

## 2019-04-13 LAB — NOVEL CORONAVIRUS, NAA: SARS-CoV-2, NAA: NOT DETECTED

## 2019-04-13 LAB — INPATIENT

## 2021-12-27 ENCOUNTER — Other Ambulatory Visit: Payer: Self-pay

## 2021-12-27 ENCOUNTER — Encounter: Payer: Self-pay | Admitting: Emergency Medicine

## 2021-12-27 ENCOUNTER — Emergency Department
Admission: EM | Admit: 2021-12-27 | Discharge: 2021-12-27 | Disposition: A | Discharge status: Home / Self Care | Payer: Self-pay | Attending: Emergency Medicine | Admitting: Emergency Medicine

## 2021-12-27 DIAGNOSIS — R112 Nausea with vomiting, unspecified: Secondary | ICD-10-CM

## 2021-12-27 DIAGNOSIS — R1084 Generalized abdominal pain: Secondary | ICD-10-CM | POA: Insufficient documentation

## 2021-12-27 DIAGNOSIS — R1110 Vomiting, unspecified: Secondary | ICD-10-CM | POA: Insufficient documentation

## 2021-12-27 LAB — COMPREHENSIVE METABOLIC PANEL
ALT: 18 U/L (ref 0–44)
AST: 18 U/L (ref 15–41)
Albumin: 4.5 g/dL (ref 3.5–5.0)
Alkaline Phosphatase: 101 U/L (ref 38–126)
Anion gap: 11 (ref 5–15)
BUN: 27 mg/dL — ABNORMAL HIGH (ref 6–20)
CO2: 26 mmol/L (ref 22–32)
Calcium: 9.9 mg/dL (ref 8.9–10.3)
Chloride: 102 mmol/L (ref 98–111)
Creatinine, Ser: 1.07 mg/dL — ABNORMAL HIGH (ref 0.44–1.00)
GFR, Estimated: 60 mL/min — ABNORMAL LOW (ref >60)
Glucose, Bld: 175 mg/dL — ABNORMAL HIGH (ref 70–99)
Potassium: 3.8 mmol/L (ref 3.5–5.1)
Sodium: 139 mmol/L (ref 135–145)
Total Bilirubin: 1 mg/dL (ref 0.3–1.2)
Total Protein: 7.2 g/dL (ref 6.5–8.1)

## 2021-12-27 LAB — CBC
HCT: 46.1 % — ABNORMAL HIGH (ref 36.0–46.0)
Hemoglobin: 14.8 g/dL (ref 12.0–15.0)
MCH: 28.7 pg (ref 26.0–34.0)
MCHC: 32.1 g/dL (ref 30.0–36.0)
MCV: 89.5 fL (ref 80.0–100.0)
Platelets: 204 10*3/uL (ref 150–400)
RBC: 5.15 MIL/uL — ABNORMAL HIGH (ref 3.87–5.11)
RDW: 12.9 % (ref 11.5–15.5)
WBC: 8.2 10*3/uL (ref 4.0–10.5)
nRBC: 0 % (ref 0.0–0.2)

## 2021-12-27 LAB — LIPASE, BLOOD: Lipase: 27 U/L (ref 11–51)

## 2021-12-27 MED ORDER — ONDANSETRON 4 MG PO TBDP
4.0000 mg | ORAL_TABLET | Freq: Once | ORAL | Status: AC
  Administered 2021-12-27: 4 mg via ORAL
  Filled 2021-12-27: qty 1

## 2021-12-27 NOTE — ED Notes (Signed)
Pt vomited in triage.  States first time having nausea or vomiting since pain started.

## 2021-12-27 NOTE — ED Provider Notes (Signed)
[facility] Provider Note    Event Date/Time   First MD Initiated Contact with Patient 12/27/21 0421     (approximate)   History   Abdominal Pain   HPI  Cindy Larsen is a 61 y.o. female who presents to the ED for evaluation of Abdominal Pain   Patient presents to the ED for evaluation of generalized abdominal cramping and an episode of emesis.  She reports developing the symptoms within an hour after eating [employer] with generalized abdominal cramping and pain.  She had a single episode of emesis after she arrived to the ED and reports resolution of her symptoms after this episode of emesis.  She waits a couple hours after the emesis and resolution before my evaluation due to wait times, and she reports that she has had no recurrence of symptoms and reports that she feels fine and is requesting discharge.  No symptoms prior to eating [employer].  No history of intra-abdominal surgeries.   Physical Exam   Triage Vital Signs: ED Triage Vitals  Enc Vitals Group     BP 12/27/21 0200 (!) 162/90     Pulse Rate 12/27/21 0200 74     Resp 12/27/21 0200 18     Temp 12/27/21 0200 97.8 F (36.6 C)     Temp Source 12/27/21 0200 Oral     SpO2 12/27/21 0200 100 %     Weight 12/27/21 0157 155 lb (70.3 kg)     Height 12/27/21 0157 5\' 4"  (1.626 m)     Head Circumference --      Peak Flow --      Pain Score 12/27/21 0157 10     Pain Loc --      Pain Edu? --      Excl. in GC? --     Most recent vital signs: Vitals:   12/27/21 0200  BP: (!) 162/90  Pulse: 74  Resp: 18  Temp: 97.8 F (36.6 C)  SpO2: 100%    General: Awake, no distress.  Sitting up in the side of the stretcher, well-appearing and conversational. CV:  Good peripheral perfusion.  Resp:  Normal effort.  Abd:  No distention.  Soft and benign throughout MSK:  No deformity noted.  Neuro:  No focal deficits appreciated. Other:     ED Results / Procedures / Treatments   Labs (all labs ordered  are listed, but only abnormal results are displayed) Labs Reviewed  COMPREHENSIVE METABOLIC PANEL - Abnormal; Notable for the following components:      Result Value   Glucose, Bld 175 (*)    BUN 27 (*)    Creatinine, Ser 1.07 (*)    GFR, Estimated 60 (*)    All other components within normal limits  CBC - Abnormal; Notable for the following components:   RBC 5.15 (*)    HCT 46.1 (*)    All other components within normal limits  LIPASE, BLOOD  URINALYSIS, ROUTINE W REFLEX MICROSCOPIC    EKG Sinus rhythm with a rate of 64 bpm.  Normal axis and intervals.  Evidence of ischemia.  RADIOLOGY   Official radiology report(s): No results found.  PROCEDURES and INTERVENTIONS:  Procedures  Medications  ondansetron (ZOFRAN-ODT) disintegrating tablet 4 mg (4 mg Oral Given 12/27/21 0209)     IMPRESSION / MDM / ASSESSMENT AND PLAN / ED COURSE  I reviewed the triage vital signs and the nursing notes.  Differential diagnosis includes, but is not limited to, food  poisoning, SBO, acute appendicitis, diverticulitis  {Patient presents with symptoms of an acute illness or injury that is potentially life-threatening.  Patient presents to the ED for evaluation of abdominal cramping and episode of emesis, possibly foodborne related illness and ultimately suitable for outpatient management.  She was systemically well and is asymptomatic during my evaluation.  Benign exam.  Reassuring serum work-up with normal CBC, lipase and metabolic panel without significant acute derangements.  EKG is nonischemic.  I discussed the patient possibility of more severe pathology such as acute cystitis and over the CT scan, she declines indicating that because she feels better she agrees to go home and will return if anything worsens.  I think this is reasonable considering her reassuring evaluation.  We discussed return precautions.      FINAL CLINICAL IMPRESSION(S) / ED DIAGNOSES   Final diagnoses:  None      Rx / DC Orders   ED Discharge Orders     None        Note:  This document was prepared using Dragon voice recognition software and may include unintentional dictation errors.   Delton Prairie, MD 12/27/21 [phone]

## 2021-12-27 NOTE — ED Triage Notes (Signed)
Pt to ED from home c/o abd pain intermittently since 7pm last night.  Denies n/v/d, SOB, fevers, or urinary changes.

## 2021-12-27 NOTE — ED Notes (Signed)
ED Provider at bedside.
